# Patient Record
Sex: Male | Born: 1950 | Race: White | Hispanic: No
Health system: Midwestern US, Academic
[De-identification: ages and names within clinical notes are randomized; demographics above are authoritative.]

---

## 2017-01-26 ENCOUNTER — Ambulatory Visit: Admit: 2017-01-26 | Discharge: 2017-01-26 | Payer: MEDICAID

## 2017-01-26 ENCOUNTER — Ambulatory Visit: Admit: 2017-01-26 | Discharge: 2017-01-27 | Payer: MEDICAID

## 2017-01-26 ENCOUNTER — Encounter: Admit: 2017-01-26 | Discharge: 2017-01-26

## 2017-01-26 DIAGNOSIS — B1920 Unspecified viral hepatitis C without hepatic coma: ICD-10-CM

## 2017-01-26 DIAGNOSIS — I10 Essential (primary) hypertension: Principal | ICD-10-CM

## 2017-01-26 DIAGNOSIS — K862 Cyst of pancreas: Principal | ICD-10-CM

## 2017-01-26 LAB — COMPREHENSIVE METABOLIC PANEL
Lab: 0.8 mg/dL — ABNORMAL HIGH (ref 0.4–1.24)
Lab: 1.2 mg/dL (ref 0.3–1.2)
Lab: 127 U/L — ABNORMAL HIGH (ref 25–110)
Lab: 139 MMOL/L (ref 137–147)
Lab: 28 MMOL/L (ref 21–30)
Lab: 4.1 g/dL (ref 3.5–5.0)
Lab: 4.3 MMOL/L (ref 3.5–5.1)
Lab: 5 (ref 3–12)
Lab: 50 U/L (ref 7–56)
Lab: 50 U/L — ABNORMAL HIGH (ref 7–40)
Lab: 7.5 g/dL (ref 6.0–8.0)
Lab: 9.8 mg/dL (ref 8.5–10.6)
Lab: >60 mL/min (ref >60)
Lab: >60 mL/min (ref >60)

## 2017-01-26 LAB — CELIAC SCREEN

## 2017-01-26 LAB — CBC
Lab: 4.4 M/UL (ref 4.4–5.5)
Lab: 8.8 10*3/uL (ref 4.5–11.0)

## 2017-01-26 LAB — GLIADIN, DEAMIDATED IGA: Lab: 12 U/mL (ref <15)

## 2017-01-26 MED ORDER — SODIUM CHLORIDE 0.9 % IV SOLP
INTRAVENOUS | 0 refills | Status: CN
Start: 2017-01-26 — End: ?

## 2017-01-26 NOTE — Progress Notes
Subjective:       History of Present Illness  Alexis Weeks is a 66 y.o. male.  This  is a 66 year old Caucasian male who is been referred to [place] for abnormality in the pancreas seen on MRI.  Patient underwent an MRI/MRCP in August 2017.  He is unable to tell me what was the reason for this.  This reported an abrupt cutoff at the level of the distal bile duct.  This was suspicious for a papillary mass.  Following this patient had a repeat MRI done in June 2018.  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  Patient has been complaining of some weight loss.  He has lost about 40 pounds in the last 1 year.  He describes his appetite has been okay.  However he has a bowel movement after eating and therefore cut down on his p.o. intake.  His last colonoscopy was done in 2017 and was 3 tubular adenomas were removed.  He is status post cholecystectomy.  He also has chronic hepatitis C     Social history???she is single, has no children, smokes 10 cigarettes a day.  He has been smoking for the last 30years, does not drink alcohol and quit in 1985.  Prior to that drank heavily for about 15 years    Family history is noncontributory.       Review of Systems   Respiratory: Positive for cough and shortness of breath.    Cardiovascular: Positive for leg swelling.   Gastrointestinal: Positive for abdominal pain.   Musculoskeletal: Positive for back pain.   Psychiatric/Behavioral: Positive for sleep disturbance.   All other systems reviewed and are negative.  Comprehensive 10 point ROS taken, and otherwise negative except as above.      Active Ambulatory Problems     Diagnosis Date Noted   ??? Essential hypertension 02/24/2015   ??? 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 Chronic atrial fibrillation (HCC) 02/24/2015   ??? Hx of spinal surgery 02/24/2015     Resolved Ambulatory Problems     Diagnosis Date Noted   ??? No Resolved Ambulatory Problems     Past Medical History: Diagnosis Date   ??? Hypertension        Social History     Social History   ??? Marital status: Divorced     Spouse name: N/A   ??? Number of children: N/A   ??? Years of education: N/A     Social History Main Topics   ??? Smoking status: Current Every Day Smoker   ??? Smokeless tobacco: Never Used   ??? Alcohol use No   ??? Drug use: Yes     Types: Marijuana, Methamphetamines, Cocaine, Heroin   ??? Sexual activity: Not on file     Other Topics Concern   ??? Not on file     Social History Narrative   ??? No narrative on file       Family History   Problem Relation Age of Onset   ??? Arthritis Mother    ??? Hearing Loss Mother    ??? Alcohol abuse Father    ??? COPD Sister    ??? Alcohol abuse Brother        Allergies   Allergen Reactions   ??? Penicillins SEE COMMENTS     swelling       Patient Active Problem List    Diagnosis Date Noted   ??? Essential hypertension 02/24/2015   ???  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 Chronic atrial fibrillation (HCC) 02/24/2015   ??? Hx of spinal surgery 02/24/2015           Objective:         ??? azithromycin (ZITHROMAX) 250 mg tablet    ??? diltiazem CD (CARDIZEM CD) 180 mg capsule Take 1 Cap by mouth daily. Indications: HYPERTENSION, VENTRICULAR RATE CONTROL IN ATRIAL FIBRILLATION   ??? ibuprofen (MOTRIN) 800 mg tablet Take 1 Tab by mouth every 6 hours as needed for Pain. Take with food.   ??? metoprolol tartrate (LOPRESSOR) 50 mg tablet Take 1 Tab by mouth twice daily. Indications: HYPERTENSION, VENTRICULAR RATE CONTROL IN ATRIAL FIBRILLATION (Patient taking differently: Take 100 mg by mouth twice daily.)   ??? oxycodone(+) (ROXICODONE, OXY-IR) 20 mg tablet TK 1 T PO TID PRF PAIN     Vitals:    01/26/17 1017   BP: (!) 182/113   Pulse: 112   Resp: 18   Temp: 36.2 ???C (97.2 ???F)   TempSrc: Oral   Weight: 63 kg (139 lb)   Height: 182.9 cm (72)     Body mass index is 18.85 kg/m???.     Physical Exam  General appearance  alert, cooperative, no distress, appears stated age Head  Normocephalic, without obvious abnormality, atraumatic   Eyes  conjunctivae/corneas clear. EOM's intact. pupils equally round, accommodation normal.   Nose Nares normal. No drainage or sinus tenderness.   Throat Lips, mucosa, and tongue normal. Teeth and gums normal   Neck supple, symmetrical, trachea midline, and no JVD   Back   symmetric, no curvature. ROM normal. No CVA tenderness   Lungs   clear to auscultation bilaterally   Chest wall  no tenderness   Heart  regular rate and rhythm, S1, S2 normal, no murmur, click, rub or gallop   Abdomen   soft, tender in the epigastrium. Bowel sounds normal. No masses,  No organomegaly   Extremities extremities normal, atraumatic, no cyanosis or edema   Pulses 2+ and symmetric   Skin Skin color, texture, turgor normal. No rashes or lesions   Neurologic Normal      Assessment and Plan:  This  is a 66 year old Caucasian male who is been referred to [place] for abnormality in the pancreas seen on MRI.  Patient underwent an MRI/MRCP in August 2017.  He is unable to tell me what was the reason for this.  This reported an abrupt cutoff at the level of the distal bile duct.  This was suspicious for a papillary mass.  Following this patient had a repeat MRI done in June 2018.  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  Patient has been complaining of some weight loss.  He has lost about 40 pounds in the last 1 year.  He describes his appetite has been okay.  However he has a bowel movement after eating and therefore cut down on his p.o. intake.  His last colonoscopy was done in 2017 and was 3 tubular adenomas were removed.  He is status post cholecystectomy.  He also has chronic hepatitis C     I discussed with the patient that we will proceed with an endoscopic ultrasound to evaluate the pancreatic cystic lesion.  Likelihood of something more serious like a pancreatic malignancy is low.  We will also evaluate for chronic pancreatitis given his history of weight loss, chronic smoking as well as excessive alcohol intake in  the past.    We will check CBC, CMP, celiac serologies.    He also has history of chronic hepatitis C and we will refer him to hepatology for this.    Follow-up on an as-needed basis.  Thank you for allowing [place] to participate in his care and please feel free to call us if you have any questions.

## 2017-01-28 ENCOUNTER — Encounter: Admit: 2017-01-28 | Discharge: 2017-01-28

## 2017-01-28 NOTE — Telephone Encounter
Received new referral, via fax. Docs scanned in 01/28/17.

## 2017-01-31 NOTE — Telephone Encounter
Referring provider contacted via staff message requesting HCV labs.

## 2017-02-01 ENCOUNTER — Encounter: Admit: 2017-02-01 | Discharge: 2017-02-01

## 2017-02-01 DIAGNOSIS — K862 Cyst of pancreas: Principal | ICD-10-CM

## 2017-02-01 NOTE — Telephone Encounter
Spoke with pt who denies being diabetic or blood thinning medication. Pt scheduled for EUS on 04/01/16 1430 for 1300 arrival. Pt aware NPO status after midnight, pt will need driver day of procedure, pt aware of location of procedure. Prep instructions mailed to pts home address.

## 2017-02-14 ENCOUNTER — Encounter: Admit: 2017-02-14 | Discharge: 2017-02-14

## 2017-03-02 ENCOUNTER — Encounter: Admit: 2017-03-02 | Discharge: 2017-03-02

## 2017-03-17 ENCOUNTER — Encounter: Admit: 2017-03-17 | Discharge: 2017-03-17

## 2017-03-17 DIAGNOSIS — Z8619 Personal history of other infectious and parasitic diseases: Principal | ICD-10-CM

## 2017-04-01 ENCOUNTER — Encounter: Admit: 2017-04-01 | Discharge: 2017-04-01

## 2017-04-01 ENCOUNTER — Ambulatory Visit: Admit: 2017-04-01 | Discharge: 2017-04-01 | Payer: MEDICAID

## 2017-04-01 ENCOUNTER — Ambulatory Visit: Admit: 2017-04-01 | Discharge: 2017-04-01

## 2017-04-01 DIAGNOSIS — I10 Essential (primary) hypertension: ICD-10-CM

## 2017-04-01 DIAGNOSIS — R634 Abnormal weight loss: ICD-10-CM

## 2017-04-01 DIAGNOSIS — G8929 Other chronic pain: ICD-10-CM

## 2017-04-01 DIAGNOSIS — K862 Cyst of pancreas: Principal | ICD-10-CM

## 2017-04-01 LAB — HEPATITIS C ANTIBODY W REFLEX HCV PCR QUANT

## 2017-04-01 MED ORDER — FENTANYL CITRATE (PF) 50 MCG/ML IJ SOLN
25 ug | INTRAVENOUS | 0 refills | Status: CN | PRN
Start: 2017-04-01 — End: ?

## 2017-04-01 MED ORDER — ONDANSETRON HCL (PF) 4 MG/2 ML IJ SOLN
4 mg | Freq: Once | INTRAVENOUS | 0 refills | Status: CN | PRN
Start: 2017-04-01 — End: ?

## 2017-04-01 MED ORDER — LACTATED RINGERS IV SOLP
1000 mL | INTRAVENOUS | 0 refills | Status: DC
Start: 2017-04-01 — End: 2017-04-02
  Administered 2017-04-01: 21:00:00 1000.000 mL via INTRAVENOUS

## 2017-04-01 MED ORDER — FENTANYL CITRATE (PF) 50 MCG/ML IJ SOLN
50 ug | INTRAVENOUS | 0 refills | Status: CN | PRN
Start: 2017-04-01 — End: ?

## 2017-04-01 MED ORDER — PROPOFOL 10 MG/ML IV EMUL 50 ML (INFUSION)(AM)(OR)
INTRAVENOUS | 0 refills | Status: DC
Start: 2017-04-01 — End: 2017-04-01
  Administered 2017-04-01: 21:00:00 50000 ug via INTRAVENOUS
  Administered 2017-04-01: 22:00:00 150 ug/kg/min via INTRAVENOUS

## 2017-04-01 MED ORDER — HALOPERIDOL LACTATE 5 MG/ML IJ SOLN
1 mg | Freq: Once | INTRAVENOUS | 0 refills | Status: CN | PRN
Start: 2017-04-01 — End: ?

## 2017-04-01 MED ORDER — LIDOCAINE (PF) 200 MG/10 ML (2 %) IJ SYRG
0 refills | Status: DC
Start: 2017-04-01 — End: 2017-04-01
  Administered 2017-04-01: 22:00:00 100 mg via INTRAVENOUS

## 2017-04-01 NOTE — Discharge Planning (AHS/AVS)
EGD/Upper EUS/ERCP/Antegrade Enteroscopy  Post Upper Endoscopy Instructions      -Start with small sips of water at _____________.  If tolerated well, you may advance your diet as tolerated or directed by your physician.      -You may have a sore throat after the procedure for 2-3 days.  Try sucrets or lozenges to help ease the pain.  If it continues please contact us.    -If you feel feverish, have a temperature of 101 degrees or higher, persistent nausea and vomiting, abdominal pain or dark stools; please notify your nurse or GI physician.    -You may have abdominal cramping following the procedure this can be relieved by belching or passing air.    -If you have redness or swelling at the IV site, place a warm, wet washcloth over the affected areas for 15 minutes, 3-4 times a day until the redness subsides.  If symptoms continue for 2-3 days, contact your regular physician.    - If you have bleeding from your mouth, over 2 tablespoons and increasing, please notify your physician.  A small amount of bleeding is normal if a biopsy or polyps were taken.  If you are vomiting blood you need to seek immediate medical attention.    - You may resume all your routine medications, if medications need to be held your physician and/or nurse will notify you post procedure.    SPECIFIC INSTRUCTIONS  OUTPATIENTS:  A. Because of sedation and lack of coordination, UNTIL TOMORROW, DO NOT:  1. Operate any motorized vehicle - this includes driving.  2. Sign any legal documents or conduct important business matters.  3. Use any dangerous machinery (chain saw, lawnmower, etc.).  4. Drink any alcoholic beverages.  Should you have any questions or concerns after your procedure please call [phone] M-F 8am-5:00 pm. After 5:00 pm, holidays or weekends call [phone] and ask for the GI Doctor on call.    Diet after Procedure:    Please return to your previous diet as tolerated.

## 2017-04-01 NOTE — H&P (View-Only)
Pre Procedure History and Physical/Sedation Plan    Name:Alexis Weeks                                                                   MRN: [id number]                 DOB:10-08-1950          Age: 67 y.o.  Date of Service: @DATE @     Date of Procedure:  04/01/2017    Planned Procedure(s):  GI:  endoscopic ultrasound  Sedation/Medication Plan: MAC (Monitored Anesthesia Care)  Discussion/Reviews:  Physician has discussed risks and alternatives of this type of sedation and above planned procedures with patient  ___________________________________________________________________  Chief Complaint:  Weight loss, pancreatic cyst    History of Present Illness: Alexis Weeks is a 67 y.o. male with above here for EUS    Previous Anesthetic/Sedation History:  reviewed    Past Medical History:   Diagnosis Date   ??? Hypertension      Past Surgical History:   Procedure Laterality Date   ??? SPINE SURGERY  2004   ??? HX TURP  2013   ??? HERNIA REPAIR  2014    Ventral Hernia   ??? FRACTURE SURGERY     ??? HX CHOLECYSTECTOMY       Pertinent medical/surgical history reviewed  Pertinent family history reviewed  Social History     Tobacco Use   ??? Smoking status: Current Every Day Smoker   ??? Smokeless tobacco: Never Used   Substance Use Topics   ??? Alcohol use: No     Alcohol/week: 0.0 oz   ??? Drug use: Yes     Types: Marijuana, Methamphetamines, Cocaine, Heroin     Social History     Substance and Sexual Activity   Drug Use Yes   ??? Types: Marijuana, Methamphetamines, Cocaine, Heroin     Allergies:  Penicillins  Medications  Current Facility-Administered Medications   Medication   ??? lactated ringers infusion     Facility-Administered Medications Ordered in Other Encounters   Medication   ??? lidocaine (PF) injection   ??? propofol (DIPRIVAN) infusion     Review of Systems:  All other systems reviewed and are negative.           Physical Exam:  Temp: 36.5 ???C (97.7 ???F) (01/18 1504)  Pulse: 87 (01/18 1504) Respirations: 19 PER MINUTE (01/18 1504)  BP: 160/105 (01/18 1504)  General appearance: alert and well-developed  Throat: Lips, mucosa, and tongue normal. Teeth and gums normal  Lungs: clear to auscultation bilaterally  Heart: regular rate and rhythm, S1, S2 normal, no murmur, click, rub or gallop  Abdomen: soft, non-tender. Bowel sounds normal. No masses,  no organomegaly  Extremities: extremities normal, atraumatic, no cyanosis or edema  @  Airway:  airway assessment performed  Mallampati I (soft palate, uvula, fauces, tonsillar pillars visible)  Anesthesia Classification:  Per Anesthesia  NPO Status: Acceptable  Pregnancy Status: N/A    Lab/Radiology/Other Diagnostic Tests  Labs:  Relevant labs reviewed      Lawana Pai, MD  Pager

## 2017-04-05 ENCOUNTER — Encounter: Admit: 2017-04-05 | Discharge: 2017-04-05

## 2017-04-05 DIAGNOSIS — I10 Essential (primary) hypertension: Principal | ICD-10-CM

## 2017-04-06 LAB — HEPATITIS C VIRAL LOAD PCR QUANT
Lab: 253 [IU]/mL — ABNORMAL HIGH (ref <12)
Lab: 6.4 [IU]/mL — ABNORMAL HIGH (ref <1.08)

## 2017-04-13 ENCOUNTER — Encounter: Admit: 2017-04-13 | Discharge: 2017-04-13

## 2017-04-13 MED ORDER — LIPASE-PROTEASE-AMYLASE (CREON 24,000) 24,000-76,000- 120,000 UNIT PO CPDR
3 | ORAL_CAPSULE | Freq: Three times a day (TID) | ORAL | 5 refills | 30.00000 days | Status: AC
Start: 2017-04-13 — End: 2018-10-26

## 2017-05-03 ENCOUNTER — Encounter: Admit: 2017-05-03 | Discharge: 2017-05-03

## 2017-05-03 DIAGNOSIS — B1920 Unspecified viral hepatitis C without hepatic coma: Principal | ICD-10-CM

## 2017-05-04 NOTE — Telephone Encounter
Patient Name: Chanz Cahall    DOB: 09/19/1950    Insurance: [place] Medicaid    Provider Info --   - Referring: Dr. Candi Leash   - PCP: Dr. Ida Rogue    Radiology/Facility: none    Pathology/Facility: none    Endoscopy/Facility: EUS 03/2017 [place]    Reason for Visit/Diagnosis: HCV    HPI Summary: +HCV Ab and PCR quant, mildly elevated liver enzymes.    Appointment Needs --   - Provider: next available NP or MD   - Urgency: next available   - Department: LTC   - Other Public house manager, Transportation, Museum/gallery curator, Doctor, hospital)

## 2017-05-04 NOTE — Telephone Encounter
Received additional referral for patient to see hepatology. Docs in O2.

## 2017-05-10 ENCOUNTER — Encounter: Admit: 2017-05-10 | Discharge: 2017-05-10

## 2017-05-10 NOTE — Telephone Encounter
Spoke with patient scheduled initial consultation with  Dr. Pricilla Larsson on September 28, 2017 at 2:20pm.  Verified demos, mailed appointment reminder letter/map. Pt stated they have current insurance.

## 2017-06-16 ENCOUNTER — Encounter: Admit: 2017-06-16 | Discharge: 2017-06-16

## 2017-06-16 DIAGNOSIS — I10 Essential (primary) hypertension: Principal | ICD-10-CM

## 2017-07-05 ENCOUNTER — Encounter: Admit: 2017-07-05 | Discharge: 2017-07-05

## 2017-07-05 DIAGNOSIS — B182 Chronic viral hepatitis C: ICD-10-CM

## 2017-07-05 DIAGNOSIS — K759 Inflammatory liver disease, unspecified: Principal | ICD-10-CM

## 2017-07-28 ENCOUNTER — Ambulatory Visit: Admit: 2017-07-28 | Discharge: 2017-07-28 | Payer: MEDICAID

## 2017-07-28 DIAGNOSIS — K759 Inflammatory liver disease, unspecified: Principal | ICD-10-CM

## 2017-07-28 DIAGNOSIS — B182 Chronic viral hepatitis C: ICD-10-CM

## 2017-09-07 DIAGNOSIS — I482 Chronic atrial fibrillation, unspecified: ICD-10-CM

## 2017-09-08 ENCOUNTER — Inpatient Hospital Stay: Admit: 2017-09-08 | Discharge: 2017-09-08

## 2017-09-08 ENCOUNTER — Encounter: Admit: 2017-09-08 | Discharge: 2017-09-08

## 2017-09-08 DIAGNOSIS — R042 Hemoptysis: Principal | ICD-10-CM

## 2017-09-08 LAB — CBC AND DIFF
Lab: 0 10*3/uL (ref 0–0.20)
Lab: 0.3 10*3/uL (ref 0–0.45)
Lab: 8 10*3/uL (ref 4.5–11.0)

## 2017-09-08 LAB — URINALYSIS DIPSTICK
Lab: 7 mg/dL (ref 5.0–8.0)
Lab: NEGATIVE K/UL (ref 3–12)
Lab: NEGATIVE MMOL/L (ref 21–30)
Lab: NEGATIVE U/L (ref 25–110)
Lab: NEGATIVE U/L (ref 7–56)
Lab: NEGATIVE g/dL — ABNORMAL LOW (ref 3.5–5.0)
Lab: NEGATIVE g/dL — ABNORMAL LOW (ref 6.0–8.0)
Lab: NEGATIVE mg/dL (ref 0.3–1.2)
Lab: NEGATIVE mg/dL — ABNORMAL LOW (ref 8.5–10.6)

## 2017-09-08 LAB — COMPREHENSIVE METABOLIC PANEL
Lab: 143 MMOL/L — ABNORMAL LOW (ref 137–147)
Lab: >60 mL/min (ref >60)
Lab: >60 mL/min (ref >60)

## 2017-09-08 LAB — URINALYSIS, MICROSCOPIC

## 2017-09-08 LAB — POC LACTATE: Lab: 0.8 MMOL/L (ref 0.5–2.0)

## 2017-09-08 LAB — LIPASE: Lab: 16 U/L — ABNORMAL LOW (ref 11–82)

## 2017-09-08 MED ORDER — DILTIAZEM HCL 180 MG PO CP24
180 mg | Freq: Every day | ORAL | 0 refills | Status: DC
Start: 2017-09-08 — End: 2017-09-08

## 2017-09-08 MED ORDER — SODIUM CHLORIDE 0.9 % IJ SOLN
50 mL | Freq: Once | INTRAVENOUS | 0 refills | Status: CP
Start: 2017-09-08 — End: ?
  Administered 2017-09-08: 12:00:00 50 mL via INTRAVENOUS

## 2017-09-08 MED ORDER — METOPROLOL TARTRATE 100 MG PO TAB
100 mg | Freq: Two times a day (BID) | ORAL | 0 refills | Status: DC
Start: 2017-09-08 — End: 2017-09-13
  Administered 2017-09-09 – 2017-09-13 (×9): 100 mg via ORAL

## 2017-09-08 MED ORDER — LIPASE-PROTEASE-AMYLASE (CREON 24,000) 24,000-76,000- 120,000 UNIT PO CPDR
3 | Freq: Three times a day (TID) | ORAL | 0 refills | Status: CN
Start: 2017-09-08 — End: ?

## 2017-09-08 MED ORDER — PEG-ELECTROLYTE SOLN 420 GRAM PO SOLR
4 L | ORAL | 0 refills | Status: DC
Start: 2017-09-08 — End: 2017-09-08

## 2017-09-08 MED ORDER — OXYCODONE 10 MG PO TAB
10 mg | ORAL | 0 refills | Status: DC | PRN
Start: 2017-09-08 — End: 2017-09-13
  Administered 2017-09-08 – 2017-09-13 (×11): 10 mg via ORAL

## 2017-09-08 MED ORDER — ACETAMINOPHEN 325 MG PO TAB
650 mg | ORAL | 0 refills | Status: DC | PRN
Start: 2017-09-08 — End: 2017-09-13

## 2017-09-08 MED ORDER — ALBUTEROL SULFATE 90 MCG/ACTUATION IN HFAA
1-2 | RESPIRATORY_TRACT | 0 refills | Status: DC | PRN
Start: 2017-09-08 — End: 2017-09-13

## 2017-09-08 MED ORDER — LIPASE-PROTEASE-AMYLASE (CREON 24,000) 24,000-76,000- 120,000 UNIT PO CPDR
3 | Freq: Three times a day (TID) | ORAL | 0 refills | Status: DC
Start: 2017-09-08 — End: 2017-09-13
  Administered 2017-09-08 – 2017-09-13 (×11): 3 via ORAL

## 2017-09-08 MED ORDER — METHYLPREDNISOLONE SOD SUC(PF) 125 MG/2 ML IJ SOLR
62.5 mg | Freq: Once | INTRAVENOUS | 0 refills | Status: CP
Start: 2017-09-08 — End: ?
  Administered 2017-09-08: 13:00:00 62.5 mg via INTRAVENOUS

## 2017-09-08 MED ORDER — DOXYCYCLINE HYCLATE 100 MG PO TAB
100 mg | Freq: Two times a day (BID) | ORAL | 0 refills | Status: DC
Start: 2017-09-08 — End: 2017-09-11
  Administered 2017-09-08 – 2017-09-11 (×6): 100 mg via ORAL

## 2017-09-08 MED ORDER — PATIENTS OWN MEDICATION
1 | Freq: Every day | ORAL | 0 refills | Status: DC
Start: 2017-09-08 — End: 2017-09-08

## 2017-09-08 MED ORDER — METOPROLOL TARTRATE 50 MG PO TAB
50 mg | Freq: Two times a day (BID) | ORAL | 0 refills | Status: DC
Start: 2017-09-08 — End: 2017-09-08

## 2017-09-08 MED ORDER — SODIUM CHLORIDE 0.9 % IV SOLP
INTRAVENOUS | 0 refills | Status: CN
Start: 2017-09-08 — End: ?

## 2017-09-08 MED ORDER — IOHEXOL 350 MG IODINE/ML IV SOLN
65 mL | Freq: Once | INTRAVENOUS | 0 refills | Status: CP
Start: 2017-09-08 — End: ?
  Administered 2017-09-08: 12:00:00 65 mL via INTRAVENOUS

## 2017-09-08 MED ORDER — DULOXETINE 60 MG PO CPDR
60 mg | Freq: Every day | ORAL | 0 refills | Status: DC
Start: 2017-09-08 — End: 2017-09-13
  Administered 2017-09-08 – 2017-09-13 (×5): 60 mg via ORAL

## 2017-09-08 MED ORDER — GLECAPREVIR-PIBRENTASVIR 100-40 MG PO TAB
3 | Freq: Every day | ORAL | 0 refills | Status: DC
Start: 2017-09-08 — End: 2017-09-13

## 2017-09-08 MED ORDER — LISINOPRIL 10 MG PO TAB
10 mg | Freq: Every day | ORAL | 0 refills | Status: DC
Start: 2017-09-08 — End: 2017-09-13
  Administered 2017-09-08 – 2017-09-13 (×6): 10 mg via ORAL

## 2017-09-08 MED ORDER — CYCLOBENZAPRINE 10 MG PO TAB
10 mg | Freq: Three times a day (TID) | ORAL | 0 refills | Status: DC | PRN
Start: 2017-09-08 — End: 2017-09-13

## 2017-09-09 DIAGNOSIS — K920 Hematemesis: Secondary | ICD-10-CM

## 2017-09-09 LAB — IMMUNOGLOBULIN E (IGE): Lab: 17 [IU]/mL (ref <165)

## 2017-09-09 LAB — RVP VIRAL PANEL PCR

## 2017-09-09 LAB — CBC
Lab: 10 10*3/uL (ref 4.5–11.0)
Lab: 104 FL — ABNORMAL HIGH (ref 80–100)
Lab: 11 g/dL — ABNORMAL LOW (ref 13.5–16.5)
Lab: 14 % (ref 11–15)
Lab: 213 10*3/uL (ref 150–400)
Lab: 3.1 M/UL — ABNORMAL LOW (ref 4.4–5.5)
Lab: 32 % — ABNORMAL LOW (ref 40–50)
Lab: 33 g/dL (ref 32.0–36.0)
Lab: 35 pg — ABNORMAL HIGH (ref 26–34)
Lab: 8 FL (ref 7–11)
Lab: 11 K/UL — ABNORMAL HIGH (ref 4.5–11.0)

## 2017-09-09 LAB — GLOMERULAR BASEMENT MEMBRANE ANTIBODIES

## 2017-09-09 LAB — C REACTIVE PROTEIN (CRP): Lab: 0.4 mg/dL (ref <1.0)

## 2017-09-09 LAB — COMPREHENSIVE METABOLIC PANEL: Lab: 141 MMOL/L — ABNORMAL HIGH (ref >60)

## 2017-09-09 LAB — MAGNESIUM: Lab: 1.8 mg/dL — ABNORMAL HIGH (ref 1.6–2.6)

## 2017-09-09 LAB — PHOSPHORUS: Lab: 3.2 mg/dL — ABNORMAL LOW (ref 2.0–4.5)

## 2017-09-09 LAB — ANTI-NUCLEAR ANTIBODY(ANA): Lab: <80 {titer} (ref <80)

## 2017-09-09 LAB — PROCALCITONIN: Lab: 0 ng/mL — ABNORMAL HIGH (ref <0.10)

## 2017-09-09 LAB — ANTI-NEUT CYTO AB (ANCA/PANCA): Lab: <20 {titer} (ref >60)

## 2017-09-09 LAB — IMMUNOGLOBULINS-IGA,IGG,IGM: Lab: 105 mg/dL — ABNORMAL HIGH (ref 762–1488)

## 2017-09-09 LAB — HIV 1& 2 AG-AB SCRN W REFLEX HIV 1 PCR QUANT: Lab: NEGATIVE M/UL — ABNORMAL LOW (ref 4.4–5.5)

## 2017-09-09 LAB — CBC AND DIFF: Lab: 15 K/UL — ABNORMAL HIGH (ref >60)

## 2017-09-09 LAB — SED RATE: Lab: 8 mm/h (ref 0–20)

## 2017-09-10 LAB — PROTIME INR (PT): Lab: 1.1 U/L (ref 0.8–1.2)

## 2017-09-10 LAB — PHOSPHORUS: Lab: 3.9 mg/dL — ABNORMAL LOW (ref 2.0–4.5)

## 2017-09-10 LAB — CBC
Lab: 104 FL — ABNORMAL HIGH (ref 80–100)
Lab: 14 % (ref 11–15)
Lab: 179 10*3/uL (ref 150–400)
Lab: 2.7 M/UL — ABNORMAL LOW (ref 4.4–5.5)
Lab: 28 % — ABNORMAL LOW (ref 40–50)
Lab: 32 g/dL (ref 32.0–36.0)
Lab: 33 pg (ref 26–34)
Lab: 6.2 10*3/uL (ref 4.5–11.0)
Lab: 8.5 FL (ref 7–11)
Lab: 9.3 g/dL — ABNORMAL LOW (ref 13.5–16.5)

## 2017-09-10 LAB — MAGNESIUM: Lab: 1.7 mg/dL — ABNORMAL HIGH (ref 1.6–2.6)

## 2017-09-10 LAB — CBC AND DIFF: Lab: 7.4 K/UL — ABNORMAL LOW (ref 4.5–11.0)

## 2017-09-10 LAB — COMPREHENSIVE METABOLIC PANEL
Lab: 0.8 mg/dL — ABNORMAL LOW (ref >60)
Lab: 139 MMOL/L — ABNORMAL LOW (ref 137–147)
Lab: 4.4 MMOL/L — ABNORMAL HIGH (ref 3.5–5.1)
Lab: 93 mg/dL — ABNORMAL HIGH (ref 70–100)

## 2017-09-10 MED ORDER — OXYCODONE 10 MG PO TAB
10 mg | Freq: Once | ORAL | 0 refills | Status: CP
Start: 2017-09-10 — End: ?
  Administered 2017-09-11: 04:00:00 10 mg via ORAL

## 2017-09-11 LAB — T SPOT TB (QUANTIFERON TB)
Lab: 0 mL/min — ABNORMAL LOW (ref >60)
Lab: NEGATIVE mg/dL — ABNORMAL HIGH (ref 38–328)

## 2017-09-11 LAB — MAGNESIUM: Lab: 1.6 mg/dL — ABNORMAL LOW (ref 1.6–2.6)

## 2017-09-11 LAB — COMPREHENSIVE METABOLIC PANEL: Lab: 139 MMOL/L — ABNORMAL LOW (ref >60)

## 2017-09-11 LAB — CBC AND DIFF: Lab: 6.8 K/UL (ref 4.5–11.0)

## 2017-09-11 LAB — CBC: Lab: 7.2 10*3/uL (ref 4.5–11.0)

## 2017-09-11 LAB — PHOSPHORUS: Lab: 3.7 mg/dL — ABNORMAL HIGH (ref >60)

## 2017-09-11 MED ORDER — DOXYCYCLINE HYCLATE 100 MG PO TAB
100 mg | Freq: Two times a day (BID) | ORAL | 0 refills | Status: CP
Start: 2017-09-11 — End: ?
  Administered 2017-09-12: 01:00:00 100 mg via ORAL

## 2017-09-12 ENCOUNTER — Encounter: Admit: 2017-09-12 | Discharge: 2017-09-12

## 2017-09-12 DIAGNOSIS — I10 Essential (primary) hypertension: Principal | ICD-10-CM

## 2017-09-12 LAB — PHOSPHORUS: Lab: 3.1 mg/dL — ABNORMAL LOW (ref >60)

## 2017-09-12 LAB — MAGNESIUM: Lab: 1.7 mg/dL — ABNORMAL LOW (ref 1.6–2.6)

## 2017-09-12 LAB — COMPREHENSIVE METABOLIC PANEL: Lab: 141 MMOL/L — ABNORMAL LOW (ref 137–147)

## 2017-09-12 LAB — CBC AND DIFF: Lab: 5.5 K/UL — ABNORMAL HIGH (ref >60)

## 2017-09-12 MED ORDER — LIDOCAINE (PF) 200 MG/10 ML (2 %) IJ SYRG
0 refills | Status: DC
Start: 2017-09-12 — End: 2017-09-12
  Administered 2017-09-12: 20:00:00 60 mg via INTRAVENOUS

## 2017-09-12 MED ORDER — GABAPENTIN 300 MG PO CAP
300 mg | Freq: Once | ORAL | 0 refills | Status: DC
Start: 2017-09-12 — End: 2017-09-13

## 2017-09-12 MED ORDER — FENTANYL CITRATE (PF) 50 MCG/ML IJ SOLN
25 ug | INTRAVENOUS | 0 refills | Status: DC | PRN
Start: 2017-09-12 — End: 2017-09-12

## 2017-09-12 MED ORDER — DIPHENHYDRAMINE HCL 50 MG/ML IJ SOLN
25 mg | Freq: Once | INTRAVENOUS | 0 refills | Status: DC | PRN
Start: 2017-09-12 — End: 2017-09-12

## 2017-09-12 MED ORDER — PHENYLEPHRINE IN 0.9% NACL(PF) 1 MG/10 ML (100 MCG/ML) IV SYRG
INTRAVENOUS | 0 refills | Status: DC
Start: 2017-09-12 — End: 2017-09-12
  Administered 2017-09-12: 21:00:00 100 ug via INTRAVENOUS

## 2017-09-12 MED ORDER — MIDAZOLAM 1 MG/ML IJ SOLN
INTRAVENOUS | 0 refills | Status: DC
Start: 2017-09-12 — End: 2017-09-12
  Administered 2017-09-12: 20:00:00 2 mg via INTRAVENOUS

## 2017-09-12 MED ORDER — FENTANYL CITRATE (PF) 50 MCG/ML IJ SOLN
0 refills | Status: DC
Start: 2017-09-12 — End: 2017-09-12
  Administered 2017-09-12: 20:00:00 25 ug via INTRAVENOUS

## 2017-09-12 MED ORDER — SODIUM CHLORIDE 0.9 % IV SOLP
500 mL | INTRAVENOUS | 0 refills | Status: CP
Start: 2017-09-12 — End: ?
  Administered 2017-09-13: 02:00:00 500 mL via INTRAVENOUS

## 2017-09-12 MED ORDER — OXYCODONE 5 MG PO TAB
5-10 mg | Freq: Once | ORAL | 0 refills | Status: DC | PRN
Start: 2017-09-12 — End: 2017-09-12

## 2017-09-12 MED ORDER — METOCLOPRAMIDE HCL 5 MG/ML IJ SOLN
10 mg | Freq: Once | INTRAVENOUS | 0 refills | Status: DC | PRN
Start: 2017-09-12 — End: 2017-09-12

## 2017-09-12 MED ORDER — PROMETHAZINE 25 MG/ML IJ SOLN
6.25 mg | INTRAVENOUS | 0 refills | Status: DC | PRN
Start: 2017-09-12 — End: 2017-09-12

## 2017-09-12 MED ORDER — FENTANYL CITRATE (PF) 50 MCG/ML IJ SOLN
50 ug | INTRAVENOUS | 0 refills | Status: DC | PRN
Start: 2017-09-12 — End: 2017-09-12

## 2017-09-12 MED ORDER — DEXTRAN 70-HYPROMELLOSE (PF) 0.1-0.3 % OP DPET
0 refills | Status: DC
Start: 2017-09-12 — End: 2017-09-12
  Administered 2017-09-12: 20:00:00 2 [drp] via OPHTHALMIC

## 2017-09-12 MED ORDER — ONDANSETRON HCL (PF) 4 MG/2 ML IJ SOLN
INTRAVENOUS | 0 refills | Status: DC
Start: 2017-09-12 — End: 2017-09-12

## 2017-09-12 MED ORDER — SUCCINYLCHOLINE CHLORIDE 20 MG/ML IJ SOLN
INTRAVENOUS | 0 refills | Status: DC
Start: 2017-09-12 — End: 2017-09-12
  Administered 2017-09-12: 20:00:00 80 mg via INTRAVENOUS

## 2017-09-12 MED ORDER — PROPOFOL INJ 10 MG/ML IV VIAL
0 refills | Status: DC
Start: 2017-09-12 — End: 2017-09-12
  Administered 2017-09-12: 20:00:00 120 mg via INTRAVENOUS

## 2017-09-12 MED ORDER — SODIUM CHLORIDE 0.9 % IV SOLP
INTRAVENOUS | 0 refills | Status: DC
Start: 2017-09-12 — End: 2017-09-13
  Administered 2017-09-12: 20:00:00 1000.000 mL via INTRAVENOUS

## 2017-09-13 ENCOUNTER — Emergency Department: Admit: 2017-09-08 | Discharge: 2017-09-08

## 2017-09-13 ENCOUNTER — Inpatient Hospital Stay: Admit: 2017-09-08 | Discharge: 2017-09-13 | Disposition: A | Discharge status: Home / Self Care | Payer: MEDICAID

## 2017-09-13 ENCOUNTER — Inpatient Hospital Stay: Admit: 2017-09-09 | Discharge: 2017-09-09

## 2017-09-13 ENCOUNTER — Encounter: Admit: 2017-09-13 | Discharge: 2017-09-13

## 2017-09-13 DIAGNOSIS — K920 Hematemesis: Secondary | ICD-10-CM

## 2017-09-13 DIAGNOSIS — J479 Bronchiectasis, uncomplicated: Principal | ICD-10-CM

## 2017-09-13 DIAGNOSIS — I10 Essential (primary) hypertension: ICD-10-CM

## 2017-09-13 DIAGNOSIS — I959 Hypotension, unspecified: ICD-10-CM

## 2017-09-13 DIAGNOSIS — I482 Chronic atrial fibrillation: ICD-10-CM

## 2017-09-13 DIAGNOSIS — K861 Other chronic pancreatitis: ICD-10-CM

## 2017-09-13 DIAGNOSIS — B182 Chronic viral hepatitis C: ICD-10-CM

## 2017-09-13 DIAGNOSIS — Z79899 Other long term (current) drug therapy: ICD-10-CM

## 2017-09-13 DIAGNOSIS — Z9049 Acquired absence of other specified parts of digestive tract: ICD-10-CM

## 2017-09-13 DIAGNOSIS — R636 Underweight: ICD-10-CM

## 2017-09-13 DIAGNOSIS — F17210 Nicotine dependence, cigarettes, uncomplicated: ICD-10-CM

## 2017-09-13 DIAGNOSIS — J439 Emphysema, unspecified: ICD-10-CM

## 2017-09-13 DIAGNOSIS — J189 Pneumonia, unspecified organism: ICD-10-CM

## 2017-09-13 DIAGNOSIS — J9811 Atelectasis: ICD-10-CM

## 2017-09-13 DIAGNOSIS — K8681 Exocrine pancreatic insufficiency: ICD-10-CM

## 2017-09-13 DIAGNOSIS — Z681 Body mass index (BMI) 19 or less, adult: ICD-10-CM

## 2017-09-13 DIAGNOSIS — J90 Pleural effusion, not elsewhere classified: Principal | ICD-10-CM

## 2017-09-13 LAB — RVP VIRAL PANEL PCR

## 2017-09-13 LAB — COMPREHENSIVE METABOLIC PANEL: Lab: 142 MMOL/L — ABNORMAL LOW (ref >60)

## 2017-09-13 LAB — GRAM STAIN

## 2017-09-13 LAB — MAGNESIUM: Lab: 1.7 mg/dL — ABNORMAL HIGH (ref 1.6–2.6)

## 2017-09-13 LAB — CELL COUNT W/DIFF-FLUIDS: Lab: 490 /uL (ref 15–500)

## 2017-09-13 LAB — CBC AND DIFF: Lab: 6.6 K/UL — ABNORMAL LOW (ref 4.5–11.0)

## 2017-09-13 LAB — PHOSPHORUS: Lab: 3.4 mg/dL (ref 2.0–4.5)

## 2017-09-13 LAB — PJP JIROVECI QUANT PCR

## 2017-09-13 LAB — HERPES SIMPLEX PCR - NON-BLOOD

## 2017-09-13 LAB — LEUKEMIA/LYMPHOMA PANEL FLUID/TISSUE

## 2017-09-13 LAB — ASPERGILLUS GALACTOMANN: Lab: 0 {cells}/uL (ref 1500–7800)

## 2017-09-13 MED ORDER — PREDNISONE 20 MG PO TAB
ORAL_TABLET | Freq: Every day | ORAL | 0 refills | Status: AC
Start: 2017-09-13 — End: ?

## 2017-09-13 MED ORDER — APIXABAN 5 MG PO TAB
5 mg | ORAL_TABLET | Freq: Two times a day (BID) | ORAL | 1 refills | Status: AC
Start: 2017-09-13 — End: 2017-12-27

## 2017-09-13 MED ORDER — PREDNISONE 20 MG PO TAB
ORAL_TABLET | Freq: Every day | ORAL | 0 refills | Status: AC
Start: 2017-09-13 — End: 2017-09-13
  Filled 2017-09-13 (×2): qty 15, 12d supply, fill #1

## 2017-09-13 MED ORDER — DOXYCYCLINE HYCLATE 100 MG PO TAB
100 mg | ORAL_TABLET | Freq: Two times a day (BID) | ORAL | 0 refills | 8.00000 days | Status: AC
Start: 2017-09-13 — End: ?

## 2017-09-14 ENCOUNTER — Encounter: Admit: 2017-09-14 | Discharge: 2017-09-14

## 2017-09-14 DIAGNOSIS — I10 Essential (primary) hypertension: Principal | ICD-10-CM

## 2017-09-14 LAB — CMV QUANT PCR-FLUID

## 2017-09-14 LAB — CULTURE-RESP,LOWER W/SENSITIVITY: Lab: LOW % — ABNORMAL HIGH (ref 11.0–15.0)

## 2017-09-26 ENCOUNTER — Ambulatory Visit: Admit: 2017-09-26 | Discharge: 2017-09-26 | Payer: MEDICAID

## 2017-09-26 DIAGNOSIS — J90 Pleural effusion, not elsewhere classified: Principal | ICD-10-CM

## 2017-09-28 ENCOUNTER — Encounter: Admit: 2017-09-28 | Discharge: 2017-09-28

## 2017-10-03 ENCOUNTER — Encounter: Admit: 2017-10-03 | Discharge: 2017-10-03

## 2017-10-11 ENCOUNTER — Encounter: Admit: 2017-10-11 | Discharge: 2017-10-11

## 2017-10-11 DIAGNOSIS — R042 Hemoptysis: Secondary | ICD-10-CM

## 2017-10-11 DIAGNOSIS — I482 Chronic atrial fibrillation, unspecified: Principal | ICD-10-CM

## 2017-10-11 DIAGNOSIS — I10 Essential (primary) hypertension: Principal | ICD-10-CM

## 2017-10-11 MED ORDER — DILTIAZEM HCL 240 MG PO CP24
240 mg | ORAL_CAPSULE | Freq: Every day | ORAL | 3 refills | 45.00000 days | Status: AC
Start: 2017-10-11 — End: 2018-12-28
  Filled 2017-10-11 (×2): qty 90, 30d supply, fill #1

## 2017-10-11 MED ORDER — DILTIAZEM HCL 240 MG PO CP24
240 mg | ORAL_CAPSULE | Freq: Every day | ORAL | 3 refills | 45.00000 days | Status: AC
Start: 2017-10-11 — End: 2017-10-11

## 2017-10-11 MED ORDER — AZITHROMYCIN 1 GRAM PO PACK
1 | Freq: Once | ORAL | 0 refills | Status: AC
Start: 2017-10-11 — End: 2017-10-12
  Filled 2017-10-11 (×2): qty 1, 1d supply, fill #1

## 2017-10-11 MED ORDER — AZITHROMYCIN 1 GRAM PO PACK
1 | Freq: Once | ORAL | 0 refills | Status: AC
Start: 2017-10-11 — End: 2017-10-11

## 2017-10-12 ENCOUNTER — Ambulatory Visit: Admit: 2017-10-11 | Discharge: 2017-10-12 | Payer: MEDICAID

## 2017-10-12 DIAGNOSIS — I2510 Atherosclerotic heart disease of native coronary artery without angina pectoris: ICD-10-CM

## 2017-10-12 DIAGNOSIS — I10 Essential (primary) hypertension: ICD-10-CM

## 2017-10-12 DIAGNOSIS — I493 Ventricular premature depolarization: ICD-10-CM

## 2017-10-12 DIAGNOSIS — I482 Chronic atrial fibrillation: Principal | ICD-10-CM

## 2017-10-12 DIAGNOSIS — B182 Chronic viral hepatitis C: ICD-10-CM

## 2017-10-12 MED ORDER — AZITHROMYCIN 250 MG PO TAB
ORAL_TABLET | Freq: Every day | ORAL | 0 refills | Status: AC
Start: 2017-10-12 — End: ?

## 2017-10-13 ENCOUNTER — Encounter: Admit: 2017-10-13 | Discharge: 2017-10-13

## 2017-10-17 LAB — CULTURE-FUNGAL,OTHER

## 2017-10-19 ENCOUNTER — Encounter: Admit: 2017-10-19 | Discharge: 2017-10-19

## 2017-10-19 ENCOUNTER — Ambulatory Visit: Admit: 2017-10-19 | Discharge: 2017-10-19

## 2017-10-20 ENCOUNTER — Ambulatory Visit: Admit: 2017-10-20 | Discharge: 2017-10-20 | Payer: MEDICAID

## 2017-10-20 ENCOUNTER — Ambulatory Visit: Admit: 2017-10-20 | Discharge: 2017-10-20

## 2017-10-20 DIAGNOSIS — I482 Chronic atrial fibrillation: Principal | ICD-10-CM

## 2017-10-20 MED ORDER — PROPOFOL INJ 10 MG/ML IV VIAL
0 refills | Status: DC
Start: 2017-10-20 — End: 2017-10-20
  Administered 2017-10-20: 13:00:00 50 mg via INTRAVENOUS
  Administered 2017-10-20: 13:00:00 10 mg via INTRAVENOUS
  Administered 2017-10-20: 13:00:00 40 mg via INTRAVENOUS

## 2017-10-20 MED ORDER — SODIUM CHLORIDE 0.9 % IV SOLP (OR) 500ML
0 refills | Status: DC
Start: 2017-10-20 — End: 2017-10-20
  Administered 2017-10-20: 13:00:00 via INTRAVENOUS

## 2017-10-20 MED ORDER — LIDOCAINE (PF) 200 MG/10 ML (2 %) IJ SYRG
0 refills | Status: DC
Start: 2017-10-20 — End: 2017-10-20
  Administered 2017-10-20: 13:00:00 100 mg via INTRAVENOUS

## 2017-10-20 MED ORDER — PROPOFOL 10 MG/ML IV EMUL 20 ML (INFUSION)(AM)(OR)
INTRAVENOUS | 0 refills | Status: DC
Start: 2017-10-20 — End: 2017-10-20
  Administered 2017-10-20: 13:00:00 100 ug/kg/min via INTRAVENOUS

## 2017-10-24 LAB — CULTURE-TB (AFB)

## 2017-10-31 LAB — CULTURE-TB (AFB)

## 2017-11-01 ENCOUNTER — Encounter: Admit: 2017-11-01 | Discharge: 2017-11-01

## 2017-11-01 DIAGNOSIS — I482 Chronic atrial fibrillation, unspecified: Principal | ICD-10-CM

## 2017-11-01 DIAGNOSIS — Z8719 Personal history of other diseases of the digestive system: ICD-10-CM

## 2017-11-01 DIAGNOSIS — I4819 Other persistent atrial fibrillation: Principal | ICD-10-CM

## 2017-11-01 DIAGNOSIS — I4891 Unspecified atrial fibrillation: Secondary | ICD-10-CM

## 2017-11-01 MED ORDER — LIDOCAINE (PF) 10 MG/ML (1 %) IJ SOLN
.1-2 mL | INTRAMUSCULAR | 0 refills | Status: CN | PRN
Start: 2017-11-01 — End: ?

## 2017-11-01 MED ORDER — SODIUM CHLORIDE 0.9 % IV SOLP
INTRAVENOUS | 0 refills | Status: CN
Start: 2017-11-01 — End: ?

## 2017-11-01 MED ORDER — LIDOCAINE HCL 2 % MM JELP
Freq: Once | TOPICAL | 0 refills | Status: CN
Start: 2017-11-01 — End: ?

## 2017-11-02 ENCOUNTER — Encounter: Admit: 2017-11-02 | Discharge: 2017-11-02

## 2017-11-02 DIAGNOSIS — I482 Chronic atrial fibrillation, unspecified: Principal | ICD-10-CM

## 2017-11-18 ENCOUNTER — Encounter: Admit: 2017-11-18 | Discharge: 2017-11-18

## 2017-11-30 ENCOUNTER — Encounter: Admit: 2017-11-30 | Discharge: 2017-11-30

## 2017-11-30 DIAGNOSIS — I10 Essential (primary) hypertension: Principal | ICD-10-CM

## 2017-12-01 ENCOUNTER — Encounter: Admit: 2017-12-01 | Discharge: 2017-12-01

## 2017-12-01 ENCOUNTER — Ambulatory Visit: Admit: 2017-12-01 | Discharge: 2017-12-02 | Payer: MEDICAID

## 2017-12-01 ENCOUNTER — Ambulatory Visit: Admit: 2017-12-01 | Discharge: 2017-12-01

## 2017-12-01 DIAGNOSIS — I482 Chronic atrial fibrillation, unspecified: ICD-10-CM

## 2017-12-01 DIAGNOSIS — R079 Chest pain, unspecified: ICD-10-CM

## 2017-12-01 DIAGNOSIS — I4891 Unspecified atrial fibrillation: ICD-10-CM

## 2017-12-01 DIAGNOSIS — J449 Chronic obstructive pulmonary disease, unspecified: ICD-10-CM

## 2017-12-01 DIAGNOSIS — K759 Inflammatory liver disease, unspecified: ICD-10-CM

## 2017-12-01 DIAGNOSIS — I10 Essential (primary) hypertension: Secondary | ICD-10-CM

## 2017-12-01 DIAGNOSIS — Z8719 Personal history of other diseases of the digestive system: ICD-10-CM

## 2017-12-02 DIAGNOSIS — I10 Essential (primary) hypertension: ICD-10-CM

## 2017-12-08 ENCOUNTER — Encounter: Admit: 2017-12-08 | Discharge: 2017-12-08

## 2017-12-14 ENCOUNTER — Encounter: Admit: 2017-12-14 | Discharge: 2017-12-14

## 2017-12-14 ENCOUNTER — Ambulatory Visit: Admit: 2017-12-14 | Discharge: 2017-12-14

## 2017-12-14 ENCOUNTER — Ambulatory Visit: Admit: 2017-12-14 | Discharge: 2017-12-14 | Payer: MEDICAID

## 2017-12-14 DIAGNOSIS — R042 Hemoptysis: ICD-10-CM

## 2017-12-14 DIAGNOSIS — J449 Chronic obstructive pulmonary disease, unspecified: ICD-10-CM

## 2017-12-14 DIAGNOSIS — Z23 Encounter for immunization: Principal | ICD-10-CM

## 2017-12-14 DIAGNOSIS — J479 Bronchiectasis, uncomplicated: ICD-10-CM

## 2017-12-14 DIAGNOSIS — J90 Pleural effusion, not elsewhere classified: ICD-10-CM

## 2017-12-14 DIAGNOSIS — I4891 Unspecified atrial fibrillation: ICD-10-CM

## 2017-12-14 DIAGNOSIS — Z8719 Personal history of other diseases of the digestive system: ICD-10-CM

## 2017-12-14 DIAGNOSIS — K759 Inflammatory liver disease, unspecified: ICD-10-CM

## 2017-12-14 DIAGNOSIS — I10 Essential (primary) hypertension: Principal | ICD-10-CM

## 2017-12-14 DIAGNOSIS — R079 Chest pain, unspecified: ICD-10-CM

## 2017-12-14 MED ORDER — UMECLIDINIUM-VILANTEROL 62.5-25 MCG/ACTUATION IN DSDV
1 | Freq: Every day | RESPIRATORY_TRACT | 11 refills | Status: CN
Start: 2017-12-14 — End: ?

## 2017-12-14 MED ORDER — UMECLIDINIUM-VILANTEROL 62.5-25 MCG/ACTUATION IN DSDV
1 | Freq: Every day | RESPIRATORY_TRACT | 11 refills | Status: AC
Start: 2017-12-14 — End: 2018-11-29

## 2017-12-14 MED ORDER — MUCUS CLEARING DEVICE MISC DEVI
0 refills | Status: CN
Start: 2017-12-14 — End: ?

## 2017-12-19 ENCOUNTER — Encounter: Admit: 2017-12-19 | Discharge: 2017-12-19

## 2017-12-21 ENCOUNTER — Encounter: Admit: 2017-12-21 | Discharge: 2017-12-21

## 2017-12-21 LAB — COMPREHENSIVE METABOLIC PANEL
Lab: 0.9
Lab: 1
Lab: 10
Lab: 103
Lab: 13
Lab: 130 — AB (ref 40–116)
Lab: 139
Lab: 21
Lab: 26
Lab: 29
Lab: 4
Lab: 5
Lab: 74
Lab: 8.3 — AB (ref 6.1–8.1)
Lab: 86

## 2017-12-21 LAB — CBC
Lab: 12
Lab: 14
Lab: 42
Lab: 6.6
Lab: 96

## 2017-12-22 ENCOUNTER — Encounter: Admit: 2017-12-22 | Discharge: 2017-12-22

## 2017-12-26 ENCOUNTER — Encounter: Admit: 2017-12-26 | Discharge: 2017-12-26

## 2017-12-26 ENCOUNTER — Inpatient Hospital Stay: Admit: 2017-12-26 | Discharge: 2017-12-26

## 2017-12-26 ENCOUNTER — Inpatient Hospital Stay: Admit: 2017-12-26 | Discharge: 2017-12-27

## 2017-12-26 DIAGNOSIS — I10 Essential (primary) hypertension: Principal | ICD-10-CM

## 2017-12-26 DIAGNOSIS — I4891 Unspecified atrial fibrillation: Principal | ICD-10-CM

## 2017-12-26 LAB — POC PT/INR: Lab: 1.2 MMOL/L — ABNORMAL HIGH (ref 0.8–1.2)

## 2017-12-26 MED ORDER — ALBUTEROL SULFATE 90 MCG/ACTUATION IN HFAA
1-2 | RESPIRATORY_TRACT | 0 refills | Status: DC | PRN
Start: 2017-12-26 — End: 2017-12-27

## 2017-12-26 MED ORDER — OXYCODONE 5 MG PO TAB
10 mg | Freq: Three times a day (TID) | ORAL | 0 refills | Status: DC
Start: 2017-12-26 — End: 2017-12-27
  Administered 2017-12-26 – 2017-12-27 (×3): 10 mg via ORAL

## 2017-12-26 MED ORDER — POTASSIUM CHLORIDE 20 MEQ PO TBTQ
40-60 meq | ORAL | 0 refills | Status: DC | PRN
Start: 2017-12-26 — End: 2017-12-27

## 2017-12-26 MED ORDER — SODIUM CHLORIDE 0.9 % IV SOLP
INTRAVENOUS | 0 refills | Status: DC
Start: 2017-12-26 — End: 2017-12-27
  Administered 2017-12-26: 14:00:00 1000 mL via INTRAVENOUS

## 2017-12-26 MED ORDER — (INV) DABIGATRAN ETEXILATE (HSC 143789) 150 MG ORAL TABLET
150 mg | Freq: Two times a day (BID) | ORAL | 0 refills | Status: DC
Start: 2017-12-26 — End: 2017-12-27
  Administered 2017-12-27: 14:00:00 150 mg via ORAL

## 2017-12-26 MED ORDER — PHENYLEPHRINE IV DRIP (STD CONC)
0 refills | Status: DC
Start: 2017-12-26 — End: 2017-12-26
  Administered 2017-12-26 (×2): 0.4 ug/kg/min via INTRAVENOUS

## 2017-12-26 MED ORDER — UMECLIDINIUM-VILANTEROL 62.5-25 MCG/ACTUATION IN DSDV
1 | Freq: Every day | RESPIRATORY_TRACT | 0 refills | Status: DC
Start: 2017-12-26 — End: 2017-12-27
  Administered 2017-12-27: 08:00:00 1 via RESPIRATORY_TRACT

## 2017-12-26 MED ORDER — ONDANSETRON HCL (PF) 4 MG/2 ML IJ SOLN
INTRAVENOUS | 0 refills | Status: DC
Start: 2017-12-26 — End: 2017-12-26
  Administered 2017-12-26: 17:00:00 4 mg via INTRAVENOUS

## 2017-12-26 MED ORDER — ASPIRIN 81 MG PO CHEW
81 mg | ORAL_TABLET | Freq: Every day | ORAL | 3 refills | Status: CN
Start: 2017-12-26 — End: ?

## 2017-12-26 MED ORDER — OXYCODONE SR 10 MG PO 12HR TABLET
10 mg | Freq: Once | ORAL | 0 refills | Status: DC
Start: 2017-12-26 — End: 2017-12-26

## 2017-12-26 MED ORDER — DABIGATRAN ETEXILATE 150 MG PO CAP
150 mg | Freq: Once | ORAL | 0 refills | Status: DC
Start: 2017-12-26 — End: 2017-12-26

## 2017-12-26 MED ORDER — HEPARIN (PORCINE) 1,000 UNIT/ML IJ SOLN
0 refills | Status: DC
Start: 2017-12-26 — End: 2017-12-26
  Administered 2017-12-26: 16:00:00 10000 [IU] via INTRAVENOUS

## 2017-12-26 MED ORDER — LISINOPRIL 10 MG PO TAB
10 mg | Freq: Every day | ORAL | 0 refills | Status: DC
Start: 2017-12-26 — End: 2017-12-27
  Administered 2017-12-27: 14:00:00 10 mg via ORAL

## 2017-12-26 MED ORDER — SUGAMMADEX 100 MG/ML IV SOLN
INTRAVENOUS | 0 refills | Status: DC
Start: 2017-12-26 — End: 2017-12-26

## 2017-12-26 MED ORDER — TEMAZEPAM 15 MG PO CAP
15 mg | Freq: Every evening | ORAL | 0 refills | Status: DC | PRN
Start: 2017-12-26 — End: 2017-12-27

## 2017-12-26 MED ORDER — KETAMINE 10 MG/ML IJ SOLN
0 refills | Status: DC
Start: 2017-12-26 — End: 2017-12-26
  Administered 2017-12-26: 16:00:00 20 mg via INTRAVENOUS

## 2017-12-26 MED ORDER — ACETAMINOPHEN 325 MG PO TAB
650 mg | ORAL | 0 refills | Status: DC | PRN
Start: 2017-12-26 — End: 2017-12-27

## 2017-12-26 MED ORDER — MAGNESIUM SULFATE IN D5W 1 GRAM/100 ML IV PGBK
1 g | INTRAVENOUS | 0 refills | Status: DC | PRN
Start: 2017-12-26 — End: 2017-12-27

## 2017-12-26 MED ORDER — POTASSIUM CHLORIDE 20 MEQ/15 ML PO LIQD
40-60 meq | NASOGASTRIC | 0 refills | Status: DC | PRN
Start: 2017-12-26 — End: 2017-12-27

## 2017-12-26 MED ORDER — DABIGATRAN ETEXILATE 150 MG PO CAP
150 mg | Freq: Two times a day (BID) | ORAL | 0 refills | Status: DC
Start: 2017-12-26 — End: 2017-12-26

## 2017-12-26 MED ORDER — MEPERIDINE (PF) 25 MG/ML IJ SYRG
12.5 mg | INTRAVENOUS | 0 refills | Status: DC | PRN
Start: 2017-12-26 — End: 2017-12-26

## 2017-12-26 MED ORDER — ALUMINUM-MAGNESIUM HYDROXIDE 200-200 MG/5 ML PO SUSP
30 mL | ORAL | 0 refills | Status: DC | PRN
Start: 2017-12-26 — End: 2017-12-27

## 2017-12-26 MED ORDER — POTASSIUM CHLORIDE IN WATER 10 MEQ/50 ML IV PGBK
10 meq | INTRAVENOUS | 0 refills | Status: DC | PRN
Start: 2017-12-26 — End: 2017-12-27

## 2017-12-26 MED ORDER — PROPOFOL INJ 10 MG/ML IV VIAL
0 refills | Status: DC
Start: 2017-12-26 — End: 2017-12-26
  Administered 2017-12-26: 16:00:00 50 mg via INTRAVENOUS
  Administered 2017-12-26: 16:00:00 30 mg via INTRAVENOUS
  Administered 2017-12-26: 15:00:00 70 mg via INTRAVENOUS

## 2017-12-26 MED ORDER — DEXAMETHASONE SODIUM PHOSPHATE 4 MG/ML IJ SOLN
INTRAVENOUS | 0 refills | Status: DC
Start: 2017-12-26 — End: 2017-12-26
  Administered 2017-12-26: 16:00:00 4 mg via INTRAVENOUS

## 2017-12-26 MED ORDER — CYCLOBENZAPRINE 10 MG PO TAB
10 mg | Freq: Two times a day (BID) | ORAL | 0 refills | Status: DC | PRN
Start: 2017-12-26 — End: 2017-12-27
  Administered 2017-12-26: 18:00:00 10 mg via ORAL

## 2017-12-26 MED ORDER — DULOXETINE 60 MG PO CPDR
60 mg | Freq: Every day | ORAL | 0 refills | Status: DC
Start: 2017-12-26 — End: 2017-12-27
  Administered 2017-12-27: 14:00:00 60 mg via ORAL

## 2017-12-26 MED ORDER — FENTANYL CITRATE (PF) 50 MCG/ML IJ SOLN
0 refills | Status: DC
Start: 2017-12-26 — End: 2017-12-26
  Administered 2017-12-26: 15:00:00 50 ug via INTRAVENOUS

## 2017-12-26 MED ORDER — FENTANYL CITRATE (PF) 50 MCG/ML IJ SOLN
25 ug | INTRAVENOUS | 0 refills | Status: DC | PRN
Start: 2017-12-26 — End: 2017-12-27
  Administered 2017-12-26 (×4): 25 ug via INTRAVENOUS

## 2017-12-26 MED ORDER — MAGNESIUM CHLORIDE 64 MG PO TBER
535 mg | ORAL | 0 refills | Status: DC | PRN
Start: 2017-12-26 — End: 2017-12-27

## 2017-12-26 MED ORDER — APIXABAN 5 MG PO TAB
5 mg | Freq: Two times a day (BID) | ORAL | 0 refills | Status: CP
Start: 2017-12-26 — End: ?
  Administered 2017-12-26: 21:00:00 5 mg via ORAL

## 2017-12-26 MED ORDER — METOPROLOL TARTRATE 25 MG PO TAB
100 mg | Freq: Two times a day (BID) | ORAL | 0 refills | Status: DC
Start: 2017-12-26 — End: 2017-12-27
  Administered 2017-12-27 (×2): 100 mg via ORAL

## 2017-12-26 MED ORDER — ROCURONIUM 10 MG/ML IV SOLN
INTRAVENOUS | 0 refills | Status: DC
Start: 2017-12-26 — End: 2017-12-26
  Administered 2017-12-26: 15:00:00 25 mg via INTRAVENOUS

## 2017-12-26 MED ORDER — LIPASE-PROTEASE-AMYLASE (CREON 24,000) 24,000-76,000- 120,000 UNIT PO CPDR
3 | Freq: Three times a day (TID) | ORAL | 0 refills | Status: DC
Start: 2017-12-26 — End: 2017-12-27
  Administered 2017-12-26 – 2017-12-27 (×3): 3 via ORAL

## 2017-12-26 MED ORDER — (INV) DABIGATRAN ETEXILATE (HSC 143789) 150 MG ORAL TABLET
150 mg | Freq: Two times a day (BID) | ORAL | 0 refills | Status: DC
Start: 2017-12-26 — End: 2017-12-26

## 2017-12-26 MED ORDER — LIDOCAINE (PF) 10 MG/ML (1 %) IJ SOLN
.1-2 mL | INTRAMUSCULAR | 0 refills | Status: DC | PRN
Start: 2017-12-26 — End: 2017-12-26

## 2017-12-26 MED ORDER — ASPIRIN 81 MG PO CHEW
81 mg | Freq: Every day | ORAL | 0 refills | Status: DC
Start: 2017-12-26 — End: 2017-12-27
  Administered 2017-12-27: 14:00:00 81 mg via ORAL

## 2017-12-26 MED ORDER — ASPIRIN 325 MG PO TAB
325 mg | Freq: Once | ORAL | 0 refills | Status: CP
Start: 2017-12-26 — End: ?
  Administered 2017-12-26: 15:00:00 325 mg via ORAL

## 2017-12-26 MED ORDER — PROMETHAZINE 25 MG/ML IJ SOLN
6.25 mg | INTRAVENOUS | 0 refills | Status: DC | PRN
Start: 2017-12-26 — End: 2017-12-26

## 2017-12-26 MED ORDER — HYDROCODONE-ACETAMINOPHEN 5-325 MG PO TAB
1-2 | ORAL | 0 refills | Status: DC | PRN
Start: 2017-12-26 — End: 2017-12-26

## 2017-12-26 MED ORDER — FENTANYL CITRATE (PF) 50 MCG/ML IJ SOLN
25 ug | INTRAVENOUS | 0 refills | Status: DC | PRN
Start: 2017-12-26 — End: 2017-12-26

## 2017-12-26 MED ORDER — GABAPENTIN 300 MG PO CAP
300 mg | ORAL | 0 refills | Status: DC
Start: 2017-12-26 — End: 2017-12-27
  Administered 2017-12-26 – 2017-12-27 (×3): 300 mg via ORAL

## 2017-12-26 MED ORDER — DILTIAZEM HCL 240 MG PO CP24
240 mg | Freq: Every day | ORAL | 0 refills | Status: DC
Start: 2017-12-26 — End: 2017-12-27
  Administered 2017-12-27: 14:00:00 240 mg via ORAL

## 2017-12-26 MED ORDER — LIDOCAINE (PF) 200 MG/10 ML (2 %) IJ SYRG
0 refills | Status: DC
Start: 2017-12-26 — End: 2017-12-26
  Administered 2017-12-26: 15:00:00 60 mg via INTRAVENOUS

## 2017-12-26 MED ORDER — DOCUSATE SODIUM 100 MG PO CAP
100 mg | Freq: Every day | ORAL | 0 refills | Status: DC | PRN
Start: 2017-12-26 — End: 2017-12-27

## 2017-12-26 MED ORDER — DABIGATRAN ETEXILATE 150 MG PO CAP
150 mg | ORAL_CAPSULE | Freq: Two times a day (BID) | ORAL | 3 refills | Status: CN
Start: 2017-12-26 — End: ?

## 2017-12-26 MED ORDER — DEXTRAN 70-HYPROMELLOSE (PF) 0.1-0.3 % OP DPET
0 refills | Status: DC
Start: 2017-12-26 — End: 2017-12-26
  Administered 2017-12-26: 16:00:00 2 [drp] via OPHTHALMIC

## 2017-12-26 MED ORDER — LIDOCAINE HCL 2 % MM JELP
Freq: Once | TOPICAL | 0 refills | Status: DC
Start: 2017-12-26 — End: 2017-12-26

## 2017-12-27 ENCOUNTER — Encounter: Admit: 2017-12-27 | Discharge: 2017-12-27

## 2017-12-27 ENCOUNTER — Inpatient Hospital Stay: Admit: 2017-12-27 | Discharge: 2017-12-28

## 2017-12-27 ENCOUNTER — Inpatient Hospital Stay
Admit: 2017-12-26 | Discharge: 2017-12-27 | Disposition: A | Discharge status: Home / Self Care | Payer: MEDICAID | Attending: Cardiovascular Disease | Admitting: Cardiovascular Disease

## 2017-12-27 ENCOUNTER — Inpatient Hospital Stay: Admit: 2017-12-01 | Discharge: 2017-12-01

## 2017-12-27 ENCOUNTER — Inpatient Hospital Stay: Admit: 2017-12-27 | Discharge: 2017-12-27

## 2017-12-27 DIAGNOSIS — I4819 Other persistent atrial fibrillation: Principal | ICD-10-CM

## 2017-12-27 DIAGNOSIS — Z8619 Personal history of other infectious and parasitic diseases: ICD-10-CM

## 2017-12-27 DIAGNOSIS — I10 Essential (primary) hypertension: ICD-10-CM

## 2017-12-27 DIAGNOSIS — Z006 Encounter for examination for normal comparison and control in clinical research program: ICD-10-CM

## 2017-12-27 DIAGNOSIS — J449 Chronic obstructive pulmonary disease, unspecified: ICD-10-CM

## 2017-12-27 LAB — POC ACTIVATED CLOTTING TIME: Lab: 251 s

## 2017-12-27 LAB — CBC: Lab: 9.3 K/UL — ABNORMAL LOW (ref >60)

## 2017-12-27 MED ORDER — (INV) DABIGATRAN ETEXILATE (HSC 143789) 150 MG ORAL TABLET
150 mg | Freq: Two times a day (BID) | ORAL | 0 refills | Status: SS
Start: 2017-12-27 — End: 2018-04-24

## 2017-12-27 MED ORDER — ASPIRIN 81 MG PO CHEW
81 mg | ORAL_TABLET | Freq: Every day | ORAL | 3 refills | Status: AC
Start: 2017-12-27 — End: ?

## 2017-12-27 MED ORDER — OXYCODONE 5 MG PO TAB
10 mg | Freq: Once | ORAL | 0 refills | Status: CP
Start: 2017-12-27 — End: ?
  Administered 2017-12-27: 15:00:00 10 mg via ORAL

## 2017-12-28 ENCOUNTER — Encounter: Admit: 2017-12-28 | Discharge: 2017-12-28

## 2017-12-28 DIAGNOSIS — I10 Essential (primary) hypertension: Principal | ICD-10-CM

## 2017-12-28 DIAGNOSIS — K759 Inflammatory liver disease, unspecified: ICD-10-CM

## 2017-12-28 DIAGNOSIS — R079 Chest pain, unspecified: ICD-10-CM

## 2017-12-28 DIAGNOSIS — I4891 Unspecified atrial fibrillation: ICD-10-CM

## 2017-12-28 DIAGNOSIS — J449 Chronic obstructive pulmonary disease, unspecified: ICD-10-CM

## 2017-12-28 DIAGNOSIS — Z8719 Personal history of other diseases of the digestive system: ICD-10-CM

## 2017-12-29 ENCOUNTER — Encounter: Admit: 2017-12-29 | Discharge: 2017-12-29

## 2017-12-29 DIAGNOSIS — Z8719 Personal history of other diseases of the digestive system: ICD-10-CM

## 2017-12-29 DIAGNOSIS — I4891 Unspecified atrial fibrillation: ICD-10-CM

## 2017-12-29 DIAGNOSIS — I10 Essential (primary) hypertension: Principal | ICD-10-CM

## 2017-12-29 DIAGNOSIS — J449 Chronic obstructive pulmonary disease, unspecified: ICD-10-CM

## 2017-12-29 DIAGNOSIS — R079 Chest pain, unspecified: ICD-10-CM

## 2017-12-29 DIAGNOSIS — K759 Inflammatory liver disease, unspecified: ICD-10-CM

## 2018-01-18 ENCOUNTER — Encounter: Admit: 2018-01-18 | Discharge: 2018-01-18

## 2018-01-22 ENCOUNTER — Encounter: Admit: 2018-01-22 | Discharge: 2018-01-22

## 2018-01-22 DIAGNOSIS — K759 Inflammatory liver disease, unspecified: ICD-10-CM

## 2018-01-22 DIAGNOSIS — I10 Essential (primary) hypertension: Principal | ICD-10-CM

## 2018-01-22 DIAGNOSIS — R079 Chest pain, unspecified: ICD-10-CM

## 2018-01-22 DIAGNOSIS — I4891 Unspecified atrial fibrillation: ICD-10-CM

## 2018-01-22 DIAGNOSIS — J189 Pneumonia, unspecified organism: ICD-10-CM

## 2018-01-22 DIAGNOSIS — J449 Chronic obstructive pulmonary disease, unspecified: ICD-10-CM

## 2018-01-22 DIAGNOSIS — Z8719 Personal history of other diseases of the digestive system: ICD-10-CM

## 2018-01-22 LAB — COMPREHENSIVE METABOLIC PANEL
Lab: 0.5 mg/dL (ref 0.3–1.2)
Lab: 0.9 mg/dL — ABNORMAL LOW (ref 0.4–1.24)
Lab: 101 U/L (ref 25–110)
Lab: 107 MMOL/L — ABNORMAL HIGH (ref 98–110)
Lab: 11 U/L (ref 7–56)
Lab: 139 MMOL/L — ABNORMAL LOW (ref 137–147)
Lab: 15 mg/dL — ABNORMAL HIGH (ref 7–25)
Lab: 3.9 g/dL (ref 3.5–5.0)
Lab: 4 MMOL/L (ref 3.5–5.1)
Lab: 5 K/UL (ref 3–12)
Lab: 6.8 g/dL (ref 6.0–8.0)
Lab: 88 mg/dL (ref 70–100)
Lab: 9 mg/dL (ref 8.5–10.6)
Lab: >60 mL/min (ref >60)
Lab: >60 mL/min (ref >60)

## 2018-01-22 LAB — CBC AND DIFF: Lab: 6.4 K/UL (ref 4.5–11.0)

## 2018-01-22 LAB — PTT (APTT): Lab: 57 s — ABNORMAL HIGH (ref 24.0–36.5)

## 2018-01-22 LAB — PROTIME INR (PT): Lab: 1.5 M/UL — ABNORMAL HIGH (ref 0.8–1.2)

## 2018-01-22 LAB — POC LACTATE: Lab: 0.8 MMOL/L (ref 0.5–2.0)

## 2018-01-22 MED ORDER — DILTIAZEM HCL 120 MG PO CP24
240 mg | Freq: Every day | ORAL | 0 refills | Status: DC
Start: 2018-01-22 — End: 2018-01-23
  Administered 2018-01-23: 14:00:00 240 mg via ORAL

## 2018-01-22 MED ORDER — ALBUTEROL SULFATE 90 MCG/ACTUATION IN HFAA
1-2 | RESPIRATORY_TRACT | 0 refills | Status: DC | PRN
Start: 2018-01-22 — End: 2018-01-23

## 2018-01-22 MED ORDER — OXYCODONE 10 MG PO TAB
20 mg | Freq: Two times a day (BID) | ORAL | 0 refills | Status: DC
Start: 2018-01-22 — End: 2018-01-23
  Administered 2018-01-23 (×2): 20 mg via ORAL

## 2018-01-22 MED ORDER — GABAPENTIN 300 MG PO CAP
300 mg | ORAL | 0 refills | Status: DC | PRN
Start: 2018-01-22 — End: 2018-01-23

## 2018-01-22 MED ORDER — PANTOPRAZOLE 40 MG IV SOLR
40 mg | Freq: Two times a day (BID) | INTRAVENOUS | 0 refills | Status: DC
Start: 2018-01-22 — End: 2018-01-23
  Administered 2018-01-23: 18:00:00 40 mg via INTRAVENOUS

## 2018-01-22 MED ORDER — LIPASE-PROTEASE-AMYLASE (CREON 24,000) 24,000-76,000- 120,000 UNIT PO CPDR
3 | Freq: Three times a day (TID) | ORAL | 0 refills | Status: DC
Start: 2018-01-22 — End: 2018-01-23

## 2018-01-22 MED ORDER — PANTOPRAZOLE 40 MG IV SOLR
80 mg | Freq: Once | INTRAVENOUS | 0 refills | Status: CP
Start: 2018-01-22 — End: ?
  Administered 2018-01-22: 20:00:00 80 mg via INTRAVENOUS

## 2018-01-22 MED ORDER — LISINOPRIL 10 MG PO TAB
10 mg | Freq: Every day | ORAL | 0 refills | Status: DC
Start: 2018-01-22 — End: 2018-01-23
  Administered 2018-01-23: 14:00:00 10 mg via ORAL

## 2018-01-22 MED ORDER — IBUPROFEN 400 MG PO TAB
200 mg | ORAL | 0 refills | Status: DC | PRN
Start: 2018-01-22 — End: 2018-01-23

## 2018-01-22 MED ORDER — CYCLOBENZAPRINE 10 MG PO TAB
10 mg | Freq: Three times a day (TID) | ORAL | 0 refills | Status: DC | PRN
Start: 2018-01-22 — End: 2018-01-23

## 2018-01-22 MED ORDER — SODIUM CHLORIDE 0.9 % IJ SOLN
50 mL | Freq: Once | INTRAVENOUS | 0 refills | Status: CP
Start: 2018-01-22 — End: ?
  Administered 2018-01-23: 01:00:00 50 mL via INTRAVENOUS

## 2018-01-22 MED ORDER — IOHEXOL 350 MG IODINE/ML IV SOLN
70 mL | Freq: Once | INTRAVENOUS | 0 refills | Status: CP
Start: 2018-01-22 — End: ?
  Administered 2018-01-23: 01:00:00 70 mL via INTRAVENOUS

## 2018-01-22 MED ORDER — UMECLIDINIUM-VILANTEROL 62.5-25 MCG/ACTUATION IN DSDV
1 | Freq: Every day | RESPIRATORY_TRACT | 0 refills | Status: DC
Start: 2018-01-22 — End: 2018-01-23
  Administered 2018-01-23: 12:00:00 1 via RESPIRATORY_TRACT

## 2018-01-22 MED ORDER — METOPROLOL TARTRATE 100 MG PO TAB
100 mg | Freq: Two times a day (BID) | ORAL | 0 refills | Status: DC
Start: 2018-01-22 — End: 2018-01-23
  Administered 2018-01-23 (×2): 100 mg via ORAL

## 2018-01-22 MED ORDER — DULOXETINE 60 MG PO CPDR
60 mg | Freq: Every day | ORAL | 0 refills | Status: DC
Start: 2018-01-22 — End: 2018-01-23
  Administered 2018-01-23: 14:00:00 60 mg via ORAL

## 2018-01-23 ENCOUNTER — Encounter: Admit: 2018-01-23 | Discharge: 2018-01-23

## 2018-01-23 ENCOUNTER — Inpatient Hospital Stay: Admit: 2018-01-22 | Discharge: 2018-01-22

## 2018-01-23 ENCOUNTER — Emergency Department: Admit: 2018-01-22 | Discharge: 2018-01-22

## 2018-01-23 ENCOUNTER — Observation Stay: Admit: 2018-01-22 | Discharge: 2018-01-23 | Payer: MEDICAID

## 2018-01-23 DIAGNOSIS — G8929 Other chronic pain: ICD-10-CM

## 2018-01-23 DIAGNOSIS — K920 Hematemesis: ICD-10-CM

## 2018-01-23 DIAGNOSIS — I4891 Unspecified atrial fibrillation: ICD-10-CM

## 2018-01-23 DIAGNOSIS — B182 Chronic viral hepatitis C: ICD-10-CM

## 2018-01-23 DIAGNOSIS — F17210 Nicotine dependence, cigarettes, uncomplicated: ICD-10-CM

## 2018-01-23 DIAGNOSIS — J449 Chronic obstructive pulmonary disease, unspecified: ICD-10-CM

## 2018-01-23 DIAGNOSIS — Z0489 Encounter for examination and observation for other specified reasons: ICD-10-CM

## 2018-01-23 DIAGNOSIS — Z7982 Long term (current) use of aspirin: ICD-10-CM

## 2018-01-23 DIAGNOSIS — Z7901 Long term (current) use of anticoagulants: ICD-10-CM

## 2018-01-23 DIAGNOSIS — R042 Hemoptysis: Principal | ICD-10-CM

## 2018-01-23 DIAGNOSIS — I10 Essential (primary) hypertension: ICD-10-CM

## 2018-01-23 DIAGNOSIS — Z880 Allergy status to penicillin: ICD-10-CM

## 2018-01-23 LAB — CBC: Lab: 7.1 K/UL — ABNORMAL HIGH (ref >60)

## 2018-01-23 LAB — COMPREHENSIVE METABOLIC PANEL: Lab: 144 MMOL/L — ABNORMAL LOW (ref 137–147)

## 2018-01-23 MED ORDER — OXYCODONE 20 MG PO TAB
20 mg | ORAL_TABLET | Freq: Two times a day (BID) | ORAL | 0 refills | 6.00000 days | Status: AC
Start: 2018-01-23 — End: ?
  Filled 2018-01-23 (×2): qty 6, 3d supply, fill #1

## 2018-01-23 MED ORDER — OMEPRAZOLE 20 MG PO CPDR
20 mg | ORAL_CAPSULE | Freq: Every day | ORAL | 0 refills | Status: SS
Start: 2018-01-23 — End: 2018-04-24

## 2018-01-23 MED ORDER — AZITHROMYCIN 250 MG PO TAB
ORAL_TABLET | Freq: Every day | ORAL | 0 refills | Status: AC
Start: 2018-01-23 — End: ?
  Filled 2018-01-23 (×2): qty 6, 5d supply, fill #1

## 2018-01-23 MED FILL — OMEPRAZOLE 20 MG PO CPDR: 20 mg | ORAL | 30 days supply | Qty: 90 | Fill #1 | Status: CP

## 2018-02-08 ENCOUNTER — Encounter: Admit: 2018-02-08 | Discharge: 2018-02-08

## 2018-02-08 ENCOUNTER — Ambulatory Visit: Admit: 2018-02-21 | Discharge: 2018-02-21 | Payer: MEDICAID

## 2018-02-21 ENCOUNTER — Encounter: Admit: 2018-02-21 | Discharge: 2018-02-21

## 2018-02-21 ENCOUNTER — Ambulatory Visit: Admit: 2018-02-21 | Discharge: 2018-02-21

## 2018-02-21 DIAGNOSIS — J449 Chronic obstructive pulmonary disease, unspecified: ICD-10-CM

## 2018-02-21 DIAGNOSIS — I4891 Unspecified atrial fibrillation: ICD-10-CM

## 2018-02-21 DIAGNOSIS — Z880 Allergy status to penicillin: ICD-10-CM

## 2018-02-21 DIAGNOSIS — I10 Essential (primary) hypertension: ICD-10-CM

## 2018-02-21 DIAGNOSIS — K920 Hematemesis: Principal | ICD-10-CM

## 2018-02-21 DIAGNOSIS — F17210 Nicotine dependence, cigarettes, uncomplicated: ICD-10-CM

## 2018-02-21 MED ORDER — SODIUM CHLORIDE 0.9 % IV SOLP
0 refills | Status: DC
Start: 2018-02-21 — End: 2018-02-21
  Administered 2018-02-21: 15:00:00 via INTRAVENOUS

## 2018-02-21 MED ORDER — PROPOFOL INJ 10 MG/ML IV VIAL
0 refills | Status: DC
Start: 2018-02-21 — End: 2018-02-21
  Administered 2018-02-21: 15:00:00 30 mg via INTRAVENOUS

## 2018-02-21 MED ORDER — TAP WATER/SIMETHICONE IRRIGATION
0 refills | Status: DC
Start: 2018-02-21 — End: 2018-02-21
  Administered 2018-02-21: 15:00:00 60 mL

## 2018-02-21 MED ORDER — PROPOFOL 10 MG/ML IV EMUL 20 ML (INFUSION)(AM)(OR)
INTRAVENOUS | 0 refills | Status: DC
Start: 2018-02-21 — End: 2018-02-21
  Administered 2018-02-21: 15:00:00 110 ug/kg/min via INTRAVENOUS

## 2018-02-21 MED ORDER — LIDOCAINE (PF) 200 MG/10 ML (2 %) IJ SYRG
0 refills | Status: DC
Start: 2018-02-21 — End: 2018-02-21
  Administered 2018-02-21: 15:00:00 65 mg via INTRAVENOUS

## 2018-02-22 ENCOUNTER — Encounter: Admit: 2018-02-22 | Discharge: 2018-02-22

## 2018-02-22 DIAGNOSIS — R079 Chest pain, unspecified: ICD-10-CM

## 2018-02-22 DIAGNOSIS — Z8719 Personal history of other diseases of the digestive system: ICD-10-CM

## 2018-02-22 DIAGNOSIS — J449 Chronic obstructive pulmonary disease, unspecified: ICD-10-CM

## 2018-02-22 DIAGNOSIS — I4891 Unspecified atrial fibrillation: ICD-10-CM

## 2018-02-22 DIAGNOSIS — I10 Essential (primary) hypertension: Principal | ICD-10-CM

## 2018-02-22 DIAGNOSIS — K759 Inflammatory liver disease, unspecified: ICD-10-CM

## 2018-03-24 ENCOUNTER — Encounter: Admit: 2018-03-24 | Discharge: 2018-03-24

## 2018-03-27 ENCOUNTER — Ambulatory Visit: Admit: 2018-03-27 | Discharge: 2018-03-27

## 2018-03-27 ENCOUNTER — Encounter: Admit: 2018-03-27 | Discharge: 2018-03-27

## 2018-03-27 ENCOUNTER — Ambulatory Visit: Admit: 2018-03-27 | Discharge: 2018-03-27 | Payer: MEDICAID

## 2018-03-27 DIAGNOSIS — Z006 Encounter for examination for normal comparison and control in clinical research program: Secondary | ICD-10-CM

## 2018-03-27 DIAGNOSIS — I4891 Unspecified atrial fibrillation: Secondary | ICD-10-CM

## 2018-03-27 DIAGNOSIS — I10 Essential (primary) hypertension: Secondary | ICD-10-CM

## 2018-03-27 DIAGNOSIS — Z8719 Personal history of other diseases of the digestive system: Secondary | ICD-10-CM

## 2018-03-27 DIAGNOSIS — I482 Chronic atrial fibrillation, unspecified: Secondary | ICD-10-CM

## 2018-03-27 DIAGNOSIS — Z95818 Presence of other cardiac implants and grafts: Secondary | ICD-10-CM

## 2018-03-27 DIAGNOSIS — J449 Chronic obstructive pulmonary disease, unspecified: Secondary | ICD-10-CM

## 2018-03-27 DIAGNOSIS — R079 Chest pain, unspecified: Secondary | ICD-10-CM

## 2018-03-27 DIAGNOSIS — K759 Inflammatory liver disease, unspecified: Secondary | ICD-10-CM

## 2018-03-27 MED ORDER — SODIUM CHLORIDE 0.9 % IV SOLP (OR) 500ML
0 refills | Status: DC
Start: 2018-03-27 — End: 2018-03-27
  Administered 2018-03-27: 16:00:00 via INTRAVENOUS

## 2018-03-27 MED ORDER — LIDOCAINE (PF) 200 MG/10 ML (2 %) IJ SYRG
0 refills | Status: DC
Start: 2018-03-27 — End: 2018-03-27
  Administered 2018-03-27: 16:00:00 60 mg via INTRAVENOUS

## 2018-03-27 MED ORDER — PROPOFOL INJ 10 MG/ML IV VIAL
0 refills | Status: DC
Start: 2018-03-27 — End: 2018-03-27
  Administered 2018-03-27: 16:00:00 30 mg via INTRAVENOUS
  Administered 2018-03-27 (×3): 20 mg via INTRAVENOUS

## 2018-03-27 MED ORDER — PROPOFOL 10 MG/ML IV EMUL 20 ML (INFUSION)(AM)(OR)
INTRAVENOUS | 0 refills | Status: DC
Start: 2018-03-27 — End: 2018-03-27
  Administered 2018-03-27: 16:00:00 110 ug/kg/min via INTRAVENOUS

## 2018-03-28 ENCOUNTER — Encounter: Admit: 2018-03-28 | Discharge: 2018-03-28

## 2018-04-07 ENCOUNTER — Encounter: Admit: 2018-04-07 | Discharge: 2018-04-07

## 2018-04-20 ENCOUNTER — Encounter: Admit: 2018-04-20 | Discharge: 2018-04-20

## 2018-04-20 DIAGNOSIS — K920 Hematemesis: ICD-10-CM

## 2018-04-20 MED ORDER — PANTOPRAZOLE 40 MG IV SOLR
80 mg | Freq: Once | INTRAVENOUS | 0 refills | Status: CP
Start: 2018-04-20 — End: ?
  Administered 2018-04-21: 07:00:00 80 mg via INTRAVENOUS

## 2018-04-20 MED ORDER — IOHEXOL 350 MG IODINE/ML IV SOLN
80 mL | Freq: Once | INTRAVENOUS | 0 refills | Status: CP
Start: 2018-04-20 — End: ?
  Administered 2018-04-21: 06:00:00 80 mL via INTRAVENOUS

## 2018-04-20 MED ORDER — SODIUM CHLORIDE 0.9 % IJ SOLN
50 mL | Freq: Once | INTRAVENOUS | 0 refills | Status: CP
Start: 2018-04-20 — End: ?
  Administered 2018-04-21: 06:00:00 50 mL via INTRAVENOUS

## 2018-04-21 ENCOUNTER — Inpatient Hospital Stay: Admit: 2018-04-21 | Discharge: 2018-04-21

## 2018-04-21 ENCOUNTER — Encounter: Admit: 2018-04-21 | Discharge: 2018-04-21

## 2018-04-21 ENCOUNTER — Inpatient Hospital Stay: Admit: 2018-04-21 | Discharge: 2018-04-25 | Disposition: A | Discharge status: Home / Self Care | Payer: MEDICAID

## 2018-04-21 DIAGNOSIS — R042 Hemoptysis: Principal | ICD-10-CM

## 2018-04-21 LAB — POC LACTATE: Lab: 0.7 MMOL/L (ref 0.5–2.0)

## 2018-04-21 LAB — COMPREHENSIVE METABOLIC PANEL
Lab: 0.4 mg/dL (ref 0.3–1.2)
Lab: 1 mg/dL (ref 0.4–1.24)
Lab: 14 mg/dL (ref 7–25)
Lab: 141 MMOL/L — ABNORMAL LOW (ref 137–147)
Lab: 18 U/L (ref 7–40)
Lab: 3.6 g/dL (ref 3.5–5.0)
Lab: 6.4 g/dL (ref 6.0–8.0)
Lab: 9 K/UL (ref 3–12)
Lab: 9 U/L (ref 7–56)
Lab: 9.2 mg/dL — ABNORMAL HIGH (ref 8.5–10.6)
Lab: 99 U/L (ref 25–110)
Lab: >60 mL/min (ref >60)
Lab: >60 mL/min (ref >60)
Lab: 0.4 mg/dL (ref 0.3–1.2)
Lab: 0.7 mg/dL (ref 0.4–1.24)
Lab: 113 MMOL/L — ABNORMAL HIGH (ref 98–110)
Lab: 13 mg/dL (ref 7–25)
Lab: 20 MMOL/L — ABNORMAL LOW (ref 21–30)
Lab: 3.3 MMOL/L — ABNORMAL LOW (ref 3.5–5.1)
Lab: 5 g/dL — ABNORMAL LOW (ref 6.0–8.0)
Lab: 6 U/L — ABNORMAL LOW (ref 7–56)
Lab: 7 mg/dL — ABNORMAL LOW (ref 8.5–10.6)
Lab: 75 U/L (ref 25–110)
Lab: 78 mg/dL (ref 70–100)
Lab: 9 10*3/uL (ref 3–12)
Lab: >60 mL/min — ABNORMAL LOW (ref >60)

## 2018-04-21 LAB — CBC AND DIFF
Lab: 0.1 10*3/uL (ref 0–0.20)
Lab: 0.2 10*3/uL (ref 0–0.45)
Lab: 2 % (ref 0–5)
Lab: 9.5 10*3/uL (ref 4.5–11.0)
Lab: 96 FL (ref 80–100)
Lab: 0.1 K/UL — ABNORMAL HIGH (ref 0–0.20)
Lab: 2.7 M/UL — ABNORMAL LOW (ref 4.4–5.5)
Lab: 52 % — ABNORMAL LOW (ref 41–77)
Lab: 6.7 10*3/uL (ref 4.5–11.0)
Lab: 8 % (ref 4–12)

## 2018-04-21 LAB — PTT (APTT): Lab: 29 s — ABNORMAL LOW (ref 24.0–36.5)

## 2018-04-21 LAB — PROTIME INR (PT): Lab: 1.3 MMOL/L — ABNORMAL HIGH (ref 0.8–1.2)

## 2018-04-21 MED ORDER — POTASSIUM CHLORIDE 20 MEQ PO TBTQ
40 meq | Freq: Once | ORAL | 0 refills | Status: CP
Start: 2018-04-21 — End: ?
  Administered 2018-04-21: 16:00:00 40 meq via ORAL

## 2018-04-21 MED ORDER — ASPIRIN 81 MG PO CHEW
81 mg | Freq: Every day | ORAL | 0 refills | Status: DC
Start: 2018-04-21 — End: 2018-04-25
  Administered 2018-04-21 – 2018-04-25 (×5): 81 mg via ORAL

## 2018-04-21 MED ORDER — PANTOPRAZOLE 40 MG IV SOLR
40 mg | Freq: Two times a day (BID) | INTRAVENOUS | 0 refills | Status: DC
Start: 2018-04-21 — End: 2018-04-25
  Administered 2018-04-21 – 2018-04-25 (×7): 40 mg via INTRAVENOUS

## 2018-04-21 MED ORDER — POLYETHYLENE GLYCOL 3350 17 GRAM PO PWPK
1 | Freq: Every day | ORAL | 0 refills | Status: DC | PRN
Start: 2018-04-21 — End: 2018-04-25
  Administered 2018-04-25: 14:00:00 17 g via ORAL

## 2018-04-21 MED ORDER — DULOXETINE 60 MG PO CPDR
60 mg | Freq: Every day | ORAL | 0 refills | Status: DC
Start: 2018-04-21 — End: 2018-04-25
  Administered 2018-04-21 – 2018-04-25 (×4): 60 mg via ORAL

## 2018-04-21 MED ORDER — UMECLIDINIUM-VILANTEROL 62.5-25 MCG/ACTUATION IN DSDV
1 | Freq: Every day | RESPIRATORY_TRACT | 0 refills | Status: DC
Start: 2018-04-21 — End: 2018-04-25
  Administered 2018-04-21: 12:00:00 1 via RESPIRATORY_TRACT

## 2018-04-21 MED ORDER — ALBUTEROL SULFATE 90 MCG/ACTUATION IN HFAA
1-2 | RESPIRATORY_TRACT | 0 refills | Status: DC | PRN
Start: 2018-04-21 — End: 2018-04-25

## 2018-04-21 MED ORDER — LIPASE-PROTEASE-AMYLASE (CREON 24,000) 24,000-76,000- 120,000 UNIT PO CPDR
3 | Freq: Three times a day (TID) | ORAL | 0 refills | Status: DC
Start: 2018-04-21 — End: 2018-04-25
  Administered 2018-04-25: 14:00:00 3 via ORAL

## 2018-04-21 MED ORDER — MELATONIN 3 MG PO TAB
3 mg | Freq: Every evening | ORAL | 0 refills | Status: DC | PRN
Start: 2018-04-21 — End: 2018-04-25

## 2018-04-21 MED ORDER — NICOTINE 14 MG/24 HR TD PT24
1 | Freq: Every day | TRANSDERMAL | 0 refills | Status: DC
Start: 2018-04-21 — End: 2018-04-25
  Administered 2018-04-21 – 2018-04-25 (×2): 1 via TRANSDERMAL

## 2018-04-21 MED ORDER — ACETAMINOPHEN 325 MG PO TAB
650 mg | ORAL | 0 refills | Status: DC | PRN
Start: 2018-04-21 — End: 2018-04-25
  Administered 2018-04-22 – 2018-04-25 (×3): 650 mg via ORAL

## 2018-04-21 MED ORDER — ONDANSETRON HCL 4 MG PO TAB
4 mg | ORAL | 0 refills | Status: DC | PRN
Start: 2018-04-21 — End: 2018-04-25

## 2018-04-21 MED ORDER — HYDRALAZINE 20 MG/ML IJ SOLN
5 mg | INTRAVENOUS | 0 refills | Status: DC | PRN
Start: 2018-04-21 — End: 2018-04-25

## 2018-04-21 MED ORDER — DILTIAZEM HCL 240 MG PO CP24
240 mg | Freq: Every day | ORAL | 0 refills | Status: DC
Start: 2018-04-21 — End: 2018-04-25
  Administered 2018-04-21 – 2018-04-25 (×5): 240 mg via ORAL

## 2018-04-21 MED ORDER — PATCH DOCUMENTATION - NICOTINE 14 MG/24HR
Freq: Two times a day (BID) | TRANSDERMAL | 0 refills | Status: DC
Start: 2018-04-21 — End: 2018-04-25

## 2018-04-21 MED ORDER — METOPROLOL TARTRATE 50 MG PO TAB
50 mg | Freq: Two times a day (BID) | ORAL | 0 refills | Status: DC
Start: 2018-04-21 — End: 2018-04-25
  Administered 2018-04-21 – 2018-04-25 (×8): 50 mg via ORAL

## 2018-04-21 MED ORDER — ONDANSETRON HCL (PF) 4 MG/2 ML IJ SOLN
4 mg | INTRAVENOUS | 0 refills | Status: DC | PRN
Start: 2018-04-21 — End: 2018-04-25
  Administered 2018-04-22: 04:00:00 4 mg via INTRAVENOUS

## 2018-04-21 MED ORDER — OXYCODONE 5 MG PO TAB
10-20 mg | Freq: Three times a day (TID) | ORAL | 0 refills | Status: DC | PRN
Start: 2018-04-21 — End: 2018-04-22
  Administered 2018-04-21 – 2018-04-22 (×3): 20 mg via ORAL

## 2018-04-21 MED ORDER — SODIUM CHLORIDE 0.9 % IV SOLP
1000 mL | Freq: Once | INTRAVENOUS | 0 refills | Status: CP
Start: 2018-04-21 — End: ?
  Administered 2018-04-21: 10:00:00 1000 mL via INTRAVENOUS

## 2018-04-21 MED ORDER — CYCLOBENZAPRINE 10 MG PO TAB
10 mg | Freq: Three times a day (TID) | ORAL | 0 refills | Status: DC | PRN
Start: 2018-04-21 — End: 2018-04-25
  Administered 2018-04-21 – 2018-04-23 (×2): 10 mg via ORAL

## 2018-04-21 MED ORDER — OXYCODONE 5 MG PO TAB
10-20 mg | Freq: Four times a day (QID) | ORAL | 0 refills | Status: DC | PRN
Start: 2018-04-21 — End: 2018-04-25
  Administered 2018-04-22 – 2018-04-24 (×9): 20 mg via ORAL
  Administered 2018-04-25: 18:00:00 5 mg via ORAL
  Administered 2018-04-25: 14:00:00 10 mg via ORAL
  Administered 2018-04-25: 03:00:00 20 mg via ORAL
  Administered 2018-04-25: 18:00:00 5 mg via ORAL

## 2018-04-21 NOTE — Care Coordination-Inpatient
For any questions prior to 8am please call Team Med Private Night 7, pager [phone]. After 8am contact Team  Med Private L, pager [phone]

## 2018-04-21 NOTE — Case Management (ED)
monitor for dc needs.    Patient Address/Phone  [address]  [place] [address]  [phone] (home)     Emergency Contact  Extended Emergency Contact Information  Primary Emergency Contact: Mccain,Thomas  Home Phone: [phone]  Mobile Phone: [phone]  Relation: Brother  Interpreter needed? No    Forensic scientist: No, patient does not have a healthcare directive  Would patient like to fill out a (a new) Healthcare Directive?: No, patient declined  Psych Advance Directive (Psych unit only): No, patient does not have a Social research officer, government  Does the patient need discharge transport arranged?: No  Transportation Name, Phone and Availability #1: brother Elijah Birk  Does the patient use Medicaid Transportation?: No    Expected Discharge Date  Expected Discharge Date: 04/23/18  Expected Discharge Time: 1200  Discharge Planning Comments: home with intermittent family assistance    Living Situation Prior to Admission  ? Living Arrangements  Type of Residence: Home, independent  Living Arrangements: Alone  Financial risk analyst / Tub: Tub/Shower Unit  How many levels in the residence?: 1(high rise apartment building with elevator. Apartment is handicap accessible)  Can patient live on one level if needed?: Yes  Does residence have entry and/or side stairs?: No(elevator present)  Assistance needed prior to admit or anticipated on discharge: No  Who provides assistance or could if needed?: brother Maisie Fus  Are they in good health?: Yes  Can support system provide 24/7 care if needed?: Yes  ? Level of Function   Prior level of function: Independent  ? Cognitive Abilities   Cognitive Abilities: Alert and Oriented, Participates in decision making, Recognizes impact of health condition on lifestyle, Engages in problem solving and planning, Understands nature of health condition    Financial Resources  ? Coverage  Primary Insurance: Medicaid  Additional Coverage: RX

## 2018-04-22 ENCOUNTER — Encounter: Admit: 2018-04-22 | Discharge: 2018-04-22

## 2018-04-22 ENCOUNTER — Inpatient Hospital Stay: Admit: 2018-04-22 | Discharge: 2018-04-22

## 2018-04-22 DIAGNOSIS — R042 Hemoptysis: Principal | ICD-10-CM

## 2018-04-22 LAB — BASIC METABOLIC PANEL
Lab: 1.5 mg/dL — ABNORMAL HIGH (ref 0.4–1.24)
Lab: 141 MMOL/L (ref 137–147)
Lab: 23 mg/dL (ref 7–25)
Lab: 3.9 MMOL/L (ref 3.5–5.1)
Lab: 44 mL/min — ABNORMAL LOW (ref >60)
Lab: 54 mL/min — ABNORMAL LOW (ref >60)
Lab: 8.8 mg/dL (ref 8.5–10.6)
Lab: 142 MMOL/L (ref 137–147)
Lab: 22 mg/dL (ref 7–25)
Lab: 23 MMOL/L (ref 21–30)
Lab: 3.5 MMOL/L (ref 3.5–5.1)
Lab: 7.7 mg/dL — ABNORMAL LOW (ref 8.5–10.6)
Lab: 76 mg/dL (ref 70–100)
Lab: 9 (ref 3–12)
Lab: >60 mL/min (ref >60)
Lab: 7 (ref 3–12)

## 2018-04-22 LAB — COMPREHENSIVE METABOLIC PANEL
Lab: 109 MMOL/L — ABNORMAL HIGH (ref >60)
Lab: 142 MMOL/L — ABNORMAL LOW (ref 137–147)
Lab: 3.5 g/dL (ref 3.5–5.0)
Lab: 4.7 MMOL/L — ABNORMAL HIGH (ref >60)
Lab: 9 g/dL — ABNORMAL LOW (ref >60)
Lab: 91 mg/dL (ref 70–100)
Lab: >60 mL/min — ABNORMAL HIGH (ref >60)

## 2018-04-22 LAB — HEMOGLOBIN
Lab: 10 g/dL — ABNORMAL LOW (ref 13.5–16.5)
Lab: 10 g/dL — ABNORMAL LOW (ref 13.5–16.5)
Lab: 9.5 g/dL — ABNORMAL LOW (ref 13.5–16.5)

## 2018-04-22 LAB — CBC AND DIFF: Lab: 53 % (ref 41–77)

## 2018-04-22 NOTE — Progress Notes
Patient arrived to room # [phone]) via wheelchair accompanied by transport. Patient transferred to the bed without assistance. Bedside safety checks completed. Initial patient assessment completed. Refer to flowsheet for details.    Admission skin assessment completed with: Marchelle Folks, RN    Pressure injury present on arrival?: No    1. Head/Face/Neck: No  2. Trunk/Back: No  3. Upper Extremities: No  4. Lower Extremities: No  5. Pelvic/Coccyx: No  6. Assessed for device associated injury? Yes  7. Malnutrition Screening Tool (Nursing Nutrition Assessment) Completed? Yes    See Doc Flowsheet for additional wound details.     INTERVENTIONS:

## 2018-04-22 NOTE — Consults
large-volume bleeding from an unknown etiology.  For this he consistently presents to [facility] and has been scoped by GI and pulmonology to try to find an etiology for this but so far work-up has been negative.  On most recent consultation, pulmonology felt that etiology may be related to the nasal cavity/nasopharynx given the patient's smoking history suggesting a possible mass.  ENT is therefore been consulted for further evaluation.    The patient reports that with these episodes of bleeding he occasionally has blood that comes out of his nose.  He does not report a long history of nasal bleeding.  He did report that with this most recent episodes of large-volume bleeding he did have an episode of epistaxis the day before.  This episode was characterized by blood dripping down the anterior nasal passageway, and posteriorly causing him to swallow blood.  He does not report this history to be true for the prior episodes of bleeding.  He reports this nosebleed resolved spontaneously in a few moments.  The day of the large volume bleed, he does not feel like he had a nosebleed to proceed that.    Medical History:   Diagnosis Date   ??? A-fib (HCC)    ??? Chest pain     with activity   ??? COPD (chronic obstructive pulmonary disease) (HCC)    ??? Hepatitis     C, treated with pills    ??? History of GI bleed 12/01/2017    At [facility] in June, [facility].  Upper GI endoscopy did not identify a cause   ??? Hypertension      Surgical History:   Procedure Laterality Date   ??? SPINE SURGERY  2004   ??? HX TURP  2013   ??? HERNIA REPAIR  2014    Ventral Hernia   ??? ESOPHAGOGASTRODUODENOSCOPY ENDOSCOPIC ULTRASOUND N/A 04/01/2017    Performed by Vertell Novak, MD at ENDO/GI   ??? BRONCHOSCOPY DIAGNOSTIC WITH/ WITHOUT CELL WASHING - FLEXIBLE N/A 09/12/2017    Performed by Cloretta Ned, MD at [facility]   ??? BRONCHOSCOPY WITH BRONCHIAL ALVEOLAR LAVAGE - FLEXIBLE N/A 09/12/2017    Performed by Cloretta Ned, MD at [facility] ??? PERCUTANEOUS CLOSURE LEFT ATRIAL APPENDAGE WITH ENDOCARDIAL IMPLANT, TRANSEPTAL CATHETERIZATION, FOLEY, CONTRAST ANTICIPATED N/A 12/26/2017    Performed by Deniece Ree, MD at [facility]   ??? TRANSESOPHAGEAL ECHOCARDIOGRAM DURING INTERVENTION N/A 12/26/2017    Performed by Vena Austria, MD at [facility]   ??? ESOPHAGOGASTRODUODENOSCOPY WITH SPECIMEN COLLECTION BY BRUSHING/ WASHING N/A 02/21/2018    Performed by Samuel Jester, MD at ENDO/GI   ??? FRACTURE SURGERY  1970s    arm   ??? HX CHOLECYSTECTOMY  1970s   ??? INGUINAL HERNIA REPAIR Bilateral 1950s     Social History     Socioeconomic History   ??? Marital status: Divorced     Spouse name: Not on file   ??? Number of children: Not on file   ??? Years of education: Not on file   ??? Highest education level: Not on file   Occupational History   ??? Not on file   Tobacco Use   ??? Smoking status: Current Every Day Smoker     Packs/day: 0.50     Years: 51.00     Pack years: 25.50     Types: Cigarettes   ??? Smokeless tobacco: Never Used   Substance and Sexual Activity   ??? Alcohol use: Yes     Alcohol/week: 2.0  standard drinks     Types: 2 Cans of beer per week     Frequency: Never   ??? Drug use: Not Currently     Types: Marijuana, Methamphetamines, Cocaine, Heroin     Comment: stopped street drugs in 2002    ??? Sexual activity: Not on file   Other Topics Concern   ??? Not on file   Social History Narrative   ??? Not on file     Family History   Problem Relation Age of Onset   ??? Arthritis Mother    ??? Hearing Loss Mother    ??? Alcohol abuse Father    ??? COPD Sister    ??? Alcohol abuse Brother      Allergies:  Penicillins    Scheduled Meds:aspirin chewable tablet 81 mg, 81 mg, Oral, QDAY  diltiazem CD (cardIZEM CD) capsule 240 mg, 240 mg, Oral, QDAY w/breakfast  duloxetine DR (CYMBALTA) capsule 60 mg, 60 mg, Oral, QDAY w/breakfast  lipase-protease-amylase (CREON 24000) capsule 3 capsule, 3 capsule, Oral, TID w/ meals  metoprolol tartrate (LOPRESSOR) tablet 50 mg, 50 mg, Oral, BID obstructing greater than 90% of the nasal cavity.  Oral cavity: normal dentition with normal occlusion, no gingival inflammation, no lip or mucosal lesions, no palate mobility  Oropharynx: tongue normal, posterior pharynx without erythema or exudate, no old or active bleeding  Neck: supple, no masses, trachea midline, No cervical adenopathy, thyromegaly, or parotid masses  Respiratory: normal respiratory effort, no audible breath sounds  Cardiac: Regular rate, without peripheral edema  Peripheral Vascular: Warm extremities  Musculoskeletal: Good muscular tone. Moving all extremities.    Procedure: Flexible fiberoptic nasopharyngoscopy/laryngoscopy     Findings: After informed discussion of the risks, benefits, and alternatives after indications as noted above, a flexible fiberoptic laryngoscopic exam was performed. Nasal cavities were topically anesthetized and decongested with 4% lidocaine and Afrin solutions after which a flexible scope was easily advanced without difficulty into the left and right nasal cavities as well as into the nasopharynx. Nasal cavities were intact without gross mass or lesion. Nasopharynx, similarly, revealed no mass lesion or granularity. There was no ulceration. There was no gross asymmetry. Fossa of Rosenmueller was intact bilaterally. The eustachian tube orifices were intact bilaterally and patent. Posterior and lateral nasal pharyngeal walls were intact and symmetric without ulceration, mass, or granularity. Scope was now advanced distally. Visible oropharynx including lateral walls and tongue base was clear without lesion. Larynx and hypopharynx were examined. Larynx was intact with normal true vocal cord mobility bilaterally. TVC were symmetric. No discrete masses or lesions in any location. Hypopharynx was clear without asymmetry.  Airway was patent. The flexible fiberoptic  scope was then withdrawn and removed. The patient tolerated the procedure well without complications. Lab/Radiology/Other Diagnostic Tests:  24-hour labs:    Results for orders placed or performed during the hospital encounter of 04/20/18 (from the past 24 hour(s))   CBC AND DIFF    Collection Time: 04/20/18 10:15 PM   Result Value Ref Range    White Blood Cells 9.5 4.5 - 11.0 K/UL    RBC 3.43 (L) 4.4 - 5.5 M/UL    Hemoglobin 11.3 (L) 13.5 - 16.5 GM/DL    Hematocrit 96.2 (L) 40 - 50 %    MCV 96.6 80 - 100 FL    MCH 33.1 26 - 34 PG    MCHC 34.3 32.0 - 36.0 G/DL    RDW 95.2 (H) 11 - 15 %    Platelet Count 181  150 - 400 K/UL    MPV 8.2 7 - 11 FL    Neutrophils 62 41 - 77 %    Lymphocytes 28 24 - 44 %    Monocytes 7 4 - 12 %    Eosinophils 2 0 - 5 %    Basophils 1 0 - 2 %    Absolute Neutrophil Count 6.00 1.8 - 7.0 K/UL    Absolute Lymph Count 2.70 1.0 - 4.8 K/UL    Absolute Monocyte Count 0.70 0 - 0.80 K/UL    Absolute Eosinophil Count 0.20 0 - 0.45 K/UL    Absolute Basophil Count 0.10 0 - 0.20 K/UL   COMPREHENSIVE METABOLIC PANEL    Collection Time: 04/20/18 10:15 PM   Result Value Ref Range    Sodium 141 137 - 147 MMOL/L    Potassium 3.5 3.5 - 5.1 MMOL/L    Chloride 106 98 - 110 MMOL/L    Glucose 89 70 - 100 MG/DL    Blood Urea Nitrogen 14 7 - 25 MG/DL    Creatinine 4.09 0.4 - 1.24 MG/DL    Calcium 9.2 8.5 - 81.1 MG/DL    Total Protein 6.4 6.0 - 8.0 G/DL    Total Bilirubin 0.4 0.3 - 1.2 MG/DL    Albumin 3.6 3.5 - 5.0 G/DL    Alk Phosphatase 99 25 - 110 U/L    AST (SGOT) 18 7 - 40 U/L    CO2 26 21 - 30 MMOL/L    ALT (SGPT) 9 7 - 56 U/L    Anion Gap 9 3 - 12    eGFR Non African American >60 >60 mL/min    eGFR African American >60 >60 mL/min   PTT (APTT)    Collection Time: 04/20/18 10:15 PM   Result Value Ref Range    APTT 29.4 24.0 - 36.5 SEC   PROTIME INR (PT)    Collection Time: 04/20/18 10:15 PM   Result Value Ref Range    INR 1.3 (H) 0.8 - 1.2   TYPE & CROSSMATCH    Collection Time: 04/20/18 10:15 PM   Result Value Ref Range    Units Ordered 0     Crossmatch Expires 04/23/2018     Record Check FOUND ??? 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 Bronchiectasis without complication (HCC) 12/14/2017   ??? COPD (chronic obstructive pulmonary disease) (HCC) 12/14/2017   ??? History of GI bleed 12/01/2017   ??? Hemoptysis 09/08/2017   ??? Pancreatic abnormality 09/08/2017   ??? 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 Underweight 09/08/2017   ??? Essential hypertension 02/24/2015   ??? 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 Chronic atrial fibrillation 02/24/2015   ??? Hx of spinal surgery 02/24/2015

## 2018-04-23 ENCOUNTER — Encounter: Admit: 2018-04-23 | Discharge: 2018-04-23

## 2018-04-23 ENCOUNTER — Inpatient Hospital Stay: Admit: 2018-04-23 | Discharge: 2018-04-23

## 2018-04-23 DIAGNOSIS — R9389 Abnormal findings on diagnostic imaging of other specified body structures: Principal | ICD-10-CM

## 2018-04-23 DIAGNOSIS — R042 Hemoptysis: Principal | ICD-10-CM

## 2018-04-23 LAB — URINALYSIS DIPSTICK
Lab: 5 (ref 5.0–8.0)
Lab: NEGATIVE
Lab: NEGATIVE K/UL (ref 4.5–11.0)
Lab: NEGATIVE
Lab: NEGATIVE
Lab: NEGATIVE
Lab: NEGATIVE
Lab: NEGATIVE
Lab: NEGATIVE
Lab: NEGATIVE
Lab: NEGATIVE

## 2018-04-23 LAB — C REACTIVE PROTEIN (CRP): Lab: 1.3 mg/dL — ABNORMAL HIGH (ref <1.0)

## 2018-04-23 LAB — HEPATITIS B SURFACE AB

## 2018-04-23 LAB — HEMOGLOBIN
Lab: 10 g/dL — ABNORMAL LOW (ref 13.5–16.5)
Lab: 11 g/dL — ABNORMAL LOW (ref 13.5–16.5)
Lab: 8.7 g/dL — ABNORMAL LOW (ref 13.5–16.5)

## 2018-04-23 LAB — BASIC METABOLIC PANEL
Lab: 1.6 mg/dL — ABNORMAL HIGH (ref 0.4–1.24)
Lab: 12 (ref 3–12)
Lab: 135 MMOL/L — ABNORMAL LOW (ref 137–147)
Lab: 18 MMOL/L — ABNORMAL LOW (ref 21–30)
Lab: 26 mg/dL — ABNORMAL HIGH (ref 7–25)
Lab: 4.6 MMOL/L — AB (ref 3.5–5.1)
Lab: 51 mL/min — ABNORMAL LOW (ref >60)
Lab: 70 mg/dL (ref 70–100)
Lab: 8 mg/dL — ABNORMAL LOW (ref 8.5–10.6)

## 2018-04-23 LAB — CHLORIDE-URINE RANDOM: Lab: 22 MMOL/L

## 2018-04-23 LAB — CBC AND DIFF: Lab: 97 FL — ABNORMAL HIGH (ref 80–100)

## 2018-04-23 LAB — SODIUM-URINE RANDOM
Lab: 34 MMOL/L
Lab: 31 MMOL/L (ref 0–5)

## 2018-04-23 LAB — URINALYSIS, MICROSCOPIC

## 2018-04-23 LAB — PROTEIN/CR RATIO,UR RAN
Lab: 37 mg/dL
Lab: 0.3

## 2018-04-23 LAB — TROPONIN-I: Lab: 0 ng/mL (ref 0.0–0.05)

## 2018-04-23 LAB — SED RATE: Lab: 17 mm/h (ref 0–20)

## 2018-04-23 LAB — COMPREHENSIVE METABOLIC PANEL
Lab: 105 MMOL/L — ABNORMAL HIGH (ref 98–110)
Lab: 2.2 mg/dL — ABNORMAL HIGH (ref 0.4–1.24)
Lab: 91 mg/dL — ABNORMAL HIGH (ref 70–100)

## 2018-04-23 LAB — HEPATITIS A IGM: Lab: NEGATIVE

## 2018-04-23 LAB — HEPATITIS C ANTIBODY W REFLEX HCV PCR QUANT

## 2018-04-23 LAB — HEPATITIS B CORE AB TOT (IGG+IGM): Lab: POSITIVE

## 2018-04-23 LAB — TOTAL PROTEIN-URINE RANDOM: Lab: 39 mg/dL — AB

## 2018-04-23 LAB — HEPATITIS B SURFACE AG: Lab: NEGATIVE

## 2018-04-23 MED ORDER — LACTATED RINGERS IV SOLP
1000 mL | INTRAVENOUS | 0 refills | Status: CP
Start: 2018-04-23 — End: ?
  Administered 2018-04-23: 17:00:00 1000 mL via INTRAVENOUS

## 2018-04-23 NOTE — Consults
Sig: Take one tablet by mouth twice daily. ** INVESTIGATIONAL **  Patients own Medication; please send home with patient upon discharge.  Protocol DEA-LAA; Dr. Betti Cruz   albuterol (PROAIR HFA, VENTOLIN HFA, OR PROVENTIL HFA) 90 mcg/actuation inhaler  Self Yes Yes   Sig: Inhale 1-2 puffs by mouth into the lungs every 6 hours as needed for Wheezing or Shortness of Breath. Shake well before use.   aspirin 81 mg chewable tablet  Self No Yes   Sig: Chew one tablet by mouth daily. Take with food.   cholecalciferol (VITAMIN D3) 2,000 unit tablet  Self Yes Yes   Sig: Take 2,000 Units by mouth daily.   cyclobenzaprine (FLEXERIL) 10 mg tablet  Self Yes Yes   Sig: Take 10 mg by mouth three times daily as needed for Muscle Cramps.   diltiazem CD (CARDIZEM CD) 240 mg capsule  Self No Yes   Sig: Take one capsule by mouth daily.   Patient taking differently: Take 240 mg by mouth daily with breakfast.   duloxetine DR (CYMBALTA) 60 mg capsule  Self Yes Yes   Sig: Take 60 mg by mouth daily with breakfast.   gabapentin (NEURONTIN) 300 mg capsule  Self Yes Yes   Sig: Take 300 mg by mouth every 8 hours as needed.   lisinopril (PRINIVIL; ZESTRIL) 10 mg tablet  Self Yes Yes   Sig: Take 10 mg by mouth daily with breakfast.   metoprolol tartrate (LOPRESSOR) 100 mg tablet  Self Yes Yes   Sig: Take 100 mg by mouth twice daily.   omeprazole DR(+) (PRILOSEC) 20 mg capsule med not active Self No No   Sig: Take one capsule by mouth daily before breakfast.   oxycodone (ROXICODONE) 20 mg tablet  Self Yes Yes   Sig: Take 20 mg by mouth every 6 hours as needed     pancrelipase (CREON 24,000) 24,000-76,000 -120,000 units capsule  Self No Yes   Sig: Take three capsules by mouth three times daily with meals. Take 2 tabs by mouth twice daily with snacks.   Patient taking differently: Take 1 capsule by mouth twice daily with meals.   umeclidinium-vilanterol (ANORO ELLIPTA) 62.5-25 mcg/actuation inhaler  Self No Yes

## 2018-04-23 NOTE — Progress Notes
This RN to pt's room to find room smelling like smoke. RN asked pt if he had been smoking in the room. Pt admitted to smoking a cigarette in the bathroom. Educated pt on hospital policy and safety of not smoking in the hospital. When asking patient to search room to remove cigarettes, pt became agitated and did not allow RN's to search belongings. BRT called. Dr. Simeon Craft notified and aware and coming to bedside to assess patient.

## 2018-04-24 ENCOUNTER — Encounter: Admit: 2018-04-24 | Discharge: 2018-04-24

## 2018-04-24 ENCOUNTER — Inpatient Hospital Stay: Admit: 2018-04-24 | Discharge: 2018-04-24

## 2018-04-24 DIAGNOSIS — Z8719 Personal history of other diseases of the digestive system: ICD-10-CM

## 2018-04-24 DIAGNOSIS — R079 Chest pain, unspecified: ICD-10-CM

## 2018-04-24 DIAGNOSIS — J449 Chronic obstructive pulmonary disease, unspecified: ICD-10-CM

## 2018-04-24 DIAGNOSIS — I10 Essential (primary) hypertension: Principal | ICD-10-CM

## 2018-04-24 DIAGNOSIS — K759 Inflammatory liver disease, unspecified: ICD-10-CM

## 2018-04-24 DIAGNOSIS — I4891 Unspecified atrial fibrillation: ICD-10-CM

## 2018-04-24 LAB — FUNGITELL: Lab: <31

## 2018-04-24 LAB — IRON + BINDING CAPACITY + %SAT+ FERRITIN
Lab: 15 % — ABNORMAL LOW (ref 28–42)
Lab: 434 ug/dL — ABNORMAL HIGH (ref 270–380)
Lab: 66 ug/dL (ref 50–185)
Lab: 68 ng/mL (ref 30–300)

## 2018-04-24 LAB — CBC AND DIFF
Lab: 3 M/UL — ABNORMAL LOW (ref 4.4–5.5)
Lab: 33 pg — ABNORMAL HIGH (ref >60)

## 2018-04-24 LAB — RVP VIRAL PANEL PCR

## 2018-04-24 LAB — MPO/PR3 W REFLEX TO ANCA

## 2018-04-24 LAB — ASPERGILLUS (GALACTOMANNAN) AG: Lab: 0

## 2018-04-24 LAB — GLOMERULAR BASEMENT MEMBRANE ANTIBODIES

## 2018-04-24 LAB — HEMOGLOBIN: Lab: 10 g/dL — ABNORMAL LOW (ref 13.5–16.5)

## 2018-04-24 MED ORDER — PROPOFOL 10 MG/ML IV EMUL (INFUSION)(AM)(OR)
INTRAVENOUS | 0 refills | Status: DC
Start: 2018-04-24 — End: 2018-04-24
  Administered 2018-04-24: 19:00:00 150 ug/kg/min via INTRAVENOUS

## 2018-04-24 MED ORDER — SODIUM CHLORIDE 0.9 % IV SOLP
1000 mL | INTRAVENOUS | 0 refills | Status: DC
Start: 2018-04-24 — End: 2018-04-25
  Administered 2018-04-24: 18:00:00 1000 mL via INTRAVENOUS

## 2018-04-24 MED ORDER — LACTATED RINGERS IV SOLP
1000 mL | Freq: Once | INTRAVENOUS | 0 refills | Status: CP
Start: 2018-04-24 — End: ?
  Administered 2018-04-24: 17:00:00 1000 mL via INTRAVENOUS

## 2018-04-24 MED ORDER — REMIFENTANYL 1000MCG IN NS 20ML (OR)
0 refills | Status: DC
Start: 2018-04-24 — End: 2018-04-24
  Administered 2018-04-24 (×2): .06 ug/kg/min via INTRAVENOUS

## 2018-04-24 MED ORDER — SUCCINYLCHOLINE CHLORIDE 20 MG/ML IJ SOLN
0 refills | Status: DC
Start: 2018-04-24 — End: 2018-04-24
  Administered 2018-04-24: 19:00:00 120 mg via INTRAVENOUS

## 2018-04-24 MED ORDER — PROPOFOL INJ 10 MG/ML IV VIAL
INTRAVENOUS | 0 refills | Status: DC
Start: 2018-04-24 — End: 2018-04-24
  Administered 2018-04-24: 19:00:00 120 mg via INTRAVENOUS

## 2018-04-24 MED ORDER — FENTANYL CITRATE (PF) 50 MCG/ML IJ SOLN
0 refills | Status: DC
Start: 2018-04-24 — End: 2018-04-24
  Administered 2018-04-24: 19:00:00 50 ug via INTRAVENOUS

## 2018-04-24 MED ORDER — LIDOCAINE (PF) 200 MG/10 ML (2 %) IJ SYRG
0 refills | Status: DC
Start: 2018-04-24 — End: 2018-04-24
  Administered 2018-04-24: 19:00:00 60 mg via INTRAVENOUS

## 2018-04-25 ENCOUNTER — Inpatient Hospital Stay: Admit: 2018-04-21 | Discharge: 2018-04-21

## 2018-04-25 ENCOUNTER — Emergency Department: Admit: 2018-04-20 | Discharge: 2018-04-20

## 2018-04-25 ENCOUNTER — Inpatient Hospital Stay: Admit: 2018-04-24 | Discharge: 2018-04-24

## 2018-04-25 DIAGNOSIS — Z9114 Patient's other noncompliance with medication regimen: ICD-10-CM

## 2018-04-25 DIAGNOSIS — R042 Hemoptysis: Principal | ICD-10-CM

## 2018-04-25 DIAGNOSIS — J440 Chronic obstructive pulmonary disease with acute lower respiratory infection: ICD-10-CM

## 2018-04-25 DIAGNOSIS — G8929 Other chronic pain: ICD-10-CM

## 2018-04-25 DIAGNOSIS — F17210 Nicotine dependence, cigarettes, uncomplicated: ICD-10-CM

## 2018-04-25 DIAGNOSIS — Z9049 Acquired absence of other specified parts of digestive tract: ICD-10-CM

## 2018-04-25 DIAGNOSIS — J342 Deviated nasal septum: ICD-10-CM

## 2018-04-25 DIAGNOSIS — I482 Chronic atrial fibrillation, unspecified: ICD-10-CM

## 2018-04-25 DIAGNOSIS — D494 Neoplasm of unspecified behavior of bladder: ICD-10-CM

## 2018-04-25 DIAGNOSIS — N179 Acute kidney failure, unspecified: ICD-10-CM

## 2018-04-25 DIAGNOSIS — I10 Essential (primary) hypertension: ICD-10-CM

## 2018-04-25 DIAGNOSIS — E440 Moderate protein-calorie malnutrition: ICD-10-CM

## 2018-04-25 LAB — COMPREHENSIVE METABOLIC PANEL
Lab: 1 mg/dL (ref 0.3–1.2)
Lab: 1.7 mg/dL — ABNORMAL HIGH (ref 0.4–1.24)
Lab: 108 MMOL/L (ref 98–110)
Lab: 119 mg/dL — ABNORMAL HIGH (ref 70–100)
Lab: 12 U/L (ref 7–56)
Lab: 140 MMOL/L (ref 137–147)
Lab: 22 MMOL/L (ref 21–30)
Lab: 26 U/L (ref 7–40)
Lab: 3.8 g/dL (ref 3.5–5.0)
Lab: 4.8 MMOL/L (ref 3.5–5.1)
Lab: 40 mL/min — ABNORMAL LOW (ref >60)
Lab: 41 mg/dL — ABNORMAL HIGH (ref 7–25)
Lab: 49 mL/min — ABNORMAL LOW (ref >60)
Lab: 6.5 g/dL (ref 6.0–8.0)
Lab: 9 mg/dL (ref 8.5–10.6)
Lab: 95 U/L (ref 25–110)

## 2018-04-25 LAB — HEPATITIS C VIRAL LOAD PCR QUANT

## 2018-04-25 LAB — GRAM STAIN

## 2018-04-25 LAB — CBC AND DIFF
Lab: 10 g/dL — ABNORMAL LOW (ref 13.5–16.5)
Lab: 16 10*3/uL — ABNORMAL HIGH (ref 4.5–11.0)
Lab: 3.2 M/UL — ABNORMAL LOW (ref 4.4–5.5)
Lab: 31 % — ABNORMAL LOW (ref 40–50)
Lab: 33 pg (ref 26–34)
Lab: 97 FL (ref 80–100)

## 2018-04-25 LAB — HEMOGLOBIN: Lab: 11 g/dL — ABNORMAL LOW (ref 13.5–16.5)

## 2018-04-25 LAB — HERPES SIMPLEX PCR - NON-BLOOD

## 2018-04-25 MED ORDER — ACETAMINOPHEN 325 MG PO TAB
650 mg | ORAL_TABLET | ORAL | 0 refills | Status: SS | PRN
Start: 2018-04-25 — End: 2018-09-05

## 2018-04-25 NOTE — Care Plan
Problem: Falls, High Risk of  Goal: Absence of falls-Adult Patient  Outcome: Goal Achieved  Note:   High fall bundle in place. Patient and family educated on high fall risk. Bed in lowest locked position. Bed alarm in place when appropriate. Call light within reach.        Problem: Pain  Goal: Management of pain  Outcome: Goal Achieved  Note:   Pain assessed Q4 and PRN. Patient verbalizes pain on scale of 0-10. PRN pain medications used as needed. See eMAR for details.     Goal: Knowledge of pain management  Outcome: Goal Achieved  Note:   Patient educated on available pain management options available.     Goal: Progress Toward Pain Management Goals  Outcome: Goal Achieved  Note:   Patient with persistent back pain. PRN oxycodone and tylenol administered.     Problem: Nutrition Deficit  Goal: Adequate nutritional intake  Outcome: Goal Achieved  Note:   Patient tolerating cardiac diet with thin liquids.   Goal: Body weight within specified parameters  Outcome: Goal Achieved     Problem: Discharge Planning  Goal: Participation in plan of care  Outcome: Goal Achieved  Note:   Patient actively participates in plan of care.     Goal: Knowledge regarding plan of care  Outcome: Goal Achieved  Note:   Patient and family educated on plan of care. Patient encouraged to ask questions regarding plan of care. This RN will continue to update patient to any changes in plan of care.     Goal: Prepared for discharge  Outcome: Goal Achieved  Note:   Patient discharging home with family assistance. Patient educated multiple times regarding importance of follow up with PCP within 1 week for labs and scheduled follow up in April. Patient verbalizes understanding and is in agreement with discharge plan.       Problem: Respiratory Impairment (Non-Ventilated Patient)  Goal: Effective gas exchange  Outcome: Goal Achieved  Note:   Patient's breathing non labored and symmetrical.   Goal: Effective breathing pattern  Outcome: Goal Achieved Note:   Patient's breathing non labored.   Goal: Patent airway  Outcome: Goal Achieved  Note:   Patient's airway patent.      Problem: Fluid Volume, Imbalanced  Goal: Absence of dehydration  Outcome: Goal Achieved  Note:   No signs or symptoms of dehydration noted.

## 2018-04-25 NOTE — Progress Notes
Patient threatening to leave AMA. Educating patient on importance of staying at the hospital until released by the doctor. Patient leaving unit to smoke periodically. Will hold off tele pack until patient has calmed down and agreed to stay.

## 2018-04-25 NOTE — Progress Notes
RT Adult Assessment Note    NAME:Zayin Crecencio Kwiatek             MRN: [id number]             DOB:12-15-1950          AGE: 68 y.o.  ADMISSION DATE: 04/20/2018             DAYS ADMITTED: LOS: 4 days    RT Treatment Plan:  Protocol Plan: Medications  Albuterol: MDI PRN    Protocol Plan: Procedures  Comment: Anoro Q day    Additional Comments:  Impressions of the patient: Pt alert and oriented. RA. No distress.  Intervention(s)/outcome(s): none  Patient education that was completed: none  Recommendations to the care team: Anoro Q day, Alb PRN    Vital Signs:  Pulse: 89  RR: 18 PER MINUTE  SpO2: 98 %  O2 Device:    Liter Flow:    O2%: 21 %  Breath Sounds:  Clear  Respiratory Effort: Non-Labored

## 2018-04-26 ENCOUNTER — Encounter: Admit: 2018-04-26 | Discharge: 2018-04-26

## 2018-04-26 DIAGNOSIS — I4891 Unspecified atrial fibrillation: ICD-10-CM

## 2018-04-26 DIAGNOSIS — Z8719 Personal history of other diseases of the digestive system: ICD-10-CM

## 2018-04-26 DIAGNOSIS — J449 Chronic obstructive pulmonary disease, unspecified: ICD-10-CM

## 2018-04-26 DIAGNOSIS — R079 Chest pain, unspecified: ICD-10-CM

## 2018-04-26 DIAGNOSIS — K759 Inflammatory liver disease, unspecified: ICD-10-CM

## 2018-04-26 DIAGNOSIS — I10 Essential (primary) hypertension: Principal | ICD-10-CM

## 2018-04-26 LAB — CELL COUNT W/DIFF-FLUIDS
Lab: 148 /uL
Lab: 3 %
Lab: 320 /uL
Lab: 8 %
Lab: 89 %

## 2018-04-26 LAB — ASPERGILLUS GALACTOMANN: Lab: 0.1 mL/min

## 2018-04-26 LAB — PJP JIROVECI QUANT PCR

## 2018-04-26 LAB — CMV QUANT PCR-FLUID

## 2018-04-27 ENCOUNTER — Encounter: Admit: 2018-04-27 | Discharge: 2018-04-27

## 2018-04-27 LAB — CULTURE-RESP,LOWER W/SENSITIVITY: Lab: LOW MMOL/L (ref 21–30)

## 2018-04-27 MED ORDER — DOXYCYCLINE HYCLATE 100 MG PO TAB
100 mg | ORAL_TABLET | Freq: Two times a day (BID) | ORAL | 0 refills | 8.00000 days | Status: AC
Start: 2018-04-27 — End: ?

## 2018-04-27 NOTE — Telephone Encounter
Patient's BAL growing Haemophilus influenzae and strep pneumo. Will call in 14 days of doxycycline based on culture results and patient allergy of penicillins with the long duration in setting of his bronchiectasis. I attempted to call patient back to inform him of this, but there was no answer and no identifying information on voicemail. Ronni, if he calls back, please inform him of above.

## 2018-05-03 ENCOUNTER — Encounter: Admit: 2018-05-03 | Discharge: 2018-05-03

## 2018-05-09 ENCOUNTER — Encounter: Admit: 2018-05-09 | Discharge: 2018-05-09

## 2018-05-09 ENCOUNTER — Encounter: Admit: 2018-05-09 | Discharge: 2018-05-09 | Payer: MEDICAID

## 2018-05-09 DIAGNOSIS — J449 Chronic obstructive pulmonary disease, unspecified: ICD-10-CM

## 2018-05-09 DIAGNOSIS — Z8719 Personal history of other diseases of the digestive system: ICD-10-CM

## 2018-05-09 DIAGNOSIS — K759 Inflammatory liver disease, unspecified: ICD-10-CM

## 2018-05-09 DIAGNOSIS — I10 Essential (primary) hypertension: Principal | ICD-10-CM

## 2018-05-09 DIAGNOSIS — N3289 Other specified disorders of bladder: Principal | ICD-10-CM

## 2018-05-09 DIAGNOSIS — I4891 Unspecified atrial fibrillation: ICD-10-CM

## 2018-05-09 DIAGNOSIS — R079 Chest pain, unspecified: ICD-10-CM

## 2018-05-09 DIAGNOSIS — C679 Malignant neoplasm of bladder, unspecified: Principal | ICD-10-CM

## 2018-05-09 MED ORDER — CEFAZOLIN INJ 1GM IVP
2 g | Freq: Once | INTRAVENOUS | 0 refills | Status: CN
Start: 2018-05-09 — End: ?

## 2018-05-09 NOTE — Progress Notes
Neurological:      Mental Status: He is alert and oriented to person, place, and time.   Psychiatric:         Behavior: Behavior normal.         Thought Content: Thought content normal.         Judgment: Judgment normal.               Assessment and Plan:  In summary we have a 68 year old gentleman with cystoscopically confirmed bladder tumor.  We had a long discussion regarding this finding and bladder cancer.  Transurethral resection was discussed in detail.  Risk benefits and complications were reviewed.  We will schedule in the immediate future.  I thank you for permitting me to partake in his care will keep you informed of my findings and his progress.

## 2018-05-11 DIAGNOSIS — C679 Malignant neoplasm of bladder, unspecified: Principal | ICD-10-CM

## 2018-05-22 ENCOUNTER — Encounter: Admit: 2018-05-22 | Discharge: 2018-05-22

## 2018-05-22 ENCOUNTER — Ambulatory Visit: Admit: 2018-05-22 | Discharge: 2018-05-22

## 2018-05-22 ENCOUNTER — Ambulatory Visit: Admit: 2018-05-22 | Discharge: 2018-05-22 | Payer: MEDICAID

## 2018-05-22 DIAGNOSIS — C679 Malignant neoplasm of bladder, unspecified: Principal | ICD-10-CM

## 2018-05-22 DIAGNOSIS — I4891 Unspecified atrial fibrillation: ICD-10-CM

## 2018-05-22 DIAGNOSIS — R079 Chest pain, unspecified: ICD-10-CM

## 2018-05-22 DIAGNOSIS — I10 Essential (primary) hypertension: Principal | ICD-10-CM

## 2018-05-22 DIAGNOSIS — Z880 Allergy status to penicillin: ICD-10-CM

## 2018-05-22 DIAGNOSIS — Z8719 Personal history of other diseases of the digestive system: ICD-10-CM

## 2018-05-22 DIAGNOSIS — Z7982 Long term (current) use of aspirin: ICD-10-CM

## 2018-05-22 DIAGNOSIS — J449 Chronic obstructive pulmonary disease, unspecified: ICD-10-CM

## 2018-05-22 DIAGNOSIS — K759 Inflammatory liver disease, unspecified: ICD-10-CM

## 2018-05-22 MED ORDER — SUCCINYLCHOLINE CHLORIDE 20 MG/ML IJ SOLN
INTRAVENOUS | 0 refills | Status: DC
Start: 2018-05-22 — End: 2018-05-22
  Administered 2018-05-22: 21:00:00 140 mg via INTRAVENOUS

## 2018-05-22 MED ORDER — OXYCODONE 5 MG PO TAB
5-10 mg | Freq: Once | ORAL | 0 refills | Status: CP | PRN
Start: 2018-05-22 — End: ?
  Administered 2018-05-22: 22:00:00 10 mg via ORAL

## 2018-05-22 MED ORDER — FENTANYL CITRATE (PF) 50 MCG/ML IJ SOLN
50 ug | INTRAVENOUS | 0 refills | Status: DC | PRN
Start: 2018-05-22 — End: 2018-05-23

## 2018-05-22 MED ORDER — LABETALOL 5 MG/ML IV SYRG
10 mg | Freq: Once | INTRAVENOUS | 0 refills | Status: CP
Start: 2018-05-22 — End: ?
  Administered 2018-05-22: 23:00:00 10 mg via INTRAVENOUS

## 2018-05-22 MED ORDER — LIDOCAINE HCL 2 % MM JELP
0 refills | Status: DC
Start: 2018-05-22 — End: 2018-05-22
  Administered 2018-05-22: 21:00:00 20 mL via TOPICAL

## 2018-05-22 MED ORDER — DEXTRAN 70-HYPROMELLOSE (PF) 0.1-0.3 % OP DPET
0 refills | Status: DC
Start: 2018-05-22 — End: 2018-05-22
  Administered 2018-05-22: 21:00:00 2 [drp] via OPHTHALMIC

## 2018-05-22 MED ORDER — DEXAMETHASONE SODIUM PHOSPHATE 4 MG/ML IJ SOLN
INTRAVENOUS | 0 refills | Status: DC
Start: 2018-05-22 — End: 2018-05-22
  Administered 2018-05-22: 21:00:00 4 mg via INTRAVENOUS

## 2018-05-22 MED ORDER — CEFAZOLIN INJ 1GM IVP
2 g | Freq: Once | INTRAVENOUS | 0 refills | Status: CP
Start: 2018-05-22 — End: ?
  Administered 2018-05-22: 21:00:00 2 g via INTRAVENOUS

## 2018-05-22 MED ORDER — OXYCODONE-ACETAMINOPHEN 5-325 MG PO TAB
1 | ORAL_TABLET | ORAL | 0 refills | Status: SS | PRN
Start: 2018-05-22 — End: 2018-09-05

## 2018-05-22 MED ORDER — STERILE WATER IRRIGATION BAG
0 refills | Status: DC
Start: 2018-05-22 — End: 2018-05-22
  Administered 2018-05-22: 21:00:00 3000 mL

## 2018-05-22 MED ORDER — PHENAZOPYRIDINE 100 MG PO TAB
100 mg | ORAL_TABLET | Freq: Three times a day (TID) | ORAL | 0 refills | Status: SS | PRN
Start: 2018-05-22 — End: 2018-09-05

## 2018-05-22 MED ORDER — OXYBUTYNIN CHLORIDE 5 MG PO TAB
5 mg | ORAL_TABLET | Freq: Three times a day (TID) | ORAL | 3 refills | 12.00000 days | Status: AC | PRN
Start: 2018-05-22 — End: ?
  Filled 2018-05-22 (×2): qty 30, 10d supply, fill #1

## 2018-05-22 MED ORDER — ALBUTEROL SULFATE 90 MCG/ACTUATION IN HFAA
0 refills | Status: DC
Start: 2018-05-22 — End: 2018-05-22
  Administered 2018-05-22: 21:00:00 4 via RESPIRATORY_TRACT

## 2018-05-22 MED ORDER — SODIUM CHLORIDE 0.9 % IV SOLP
1000 mL | INTRAVENOUS | 0 refills | Status: DC
Start: 2018-05-22 — End: 2018-05-23
  Administered 2018-05-22: 21:00:00 1000.000 mL via INTRAVENOUS

## 2018-05-22 MED ORDER — LIDOCAINE (PF) 200 MG/10 ML (2 %) IJ SYRG
0 refills | Status: DC
Start: 2018-05-22 — End: 2018-05-22
  Administered 2018-05-22: 21:00:00 80 mg via INTRAVENOUS
  Administered 2018-05-22 (×2): 60 mg via INTRAVENOUS

## 2018-05-22 MED ORDER — CIPROFLOXACIN HCL 500 MG PO TAB
500 mg | ORAL_TABLET | Freq: Two times a day (BID) | ORAL | 0 refills | 10.00000 days | Status: AC
Start: 2018-05-22 — End: ?
  Filled 2018-05-22 (×2): qty 6, 3d supply, fill #1

## 2018-05-22 MED FILL — OXYCODONE-ACETAMINOPHEN 5-325 MG PO TAB: 5/325 mg | ORAL | 1 days supply | Qty: 5 | Fill #1 | Status: CP

## 2018-05-22 MED FILL — PHENAZOPYRIDINE 100 MG PO TAB: 100 mg | ORAL | 2 days supply | Qty: 6 | Fill #1 | Status: CP

## 2018-05-22 NOTE — Other
Brief Operative Note    Name: Alexis Weeks is a 68 y.o. 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 2 cm papillary tumor on trigone just posteromedial to the left ureteral orifice; resected entirely  2. Very tight fossa navicularis; dilated    Estimated Blood Loss: Minimal    Specimen(s) Removed/Disposition:   ID Type Source Tests Collected by Time Destination   1 : LEFT POSTEROLATERAL TUMOR Tissue Bladder Chips SURGICAL PATHOLOGY          Barbra Sarks, MD 05/22/2018 1610        Complications:  None    Implants: None    Drains: None    Disposition:  PACU - stable    Meta Hatchet, MD  Pager [phone]

## 2018-05-24 ENCOUNTER — Encounter: Admit: 2018-05-24 | Discharge: 2018-05-24

## 2018-05-24 DIAGNOSIS — Z8719 Personal history of other diseases of the digestive system: ICD-10-CM

## 2018-05-24 DIAGNOSIS — R079 Chest pain, unspecified: ICD-10-CM

## 2018-05-24 DIAGNOSIS — I10 Essential (primary) hypertension: Principal | ICD-10-CM

## 2018-05-24 DIAGNOSIS — J449 Chronic obstructive pulmonary disease, unspecified: ICD-10-CM

## 2018-05-24 DIAGNOSIS — I4891 Unspecified atrial fibrillation: ICD-10-CM

## 2018-05-24 DIAGNOSIS — K759 Inflammatory liver disease, unspecified: ICD-10-CM

## 2018-05-25 ENCOUNTER — Encounter: Admit: 2018-05-25 | Discharge: 2018-05-25

## 2018-05-29 LAB — CULTURE-FUNGAL,OTHER

## 2018-06-01 ENCOUNTER — Encounter: Admit: 2018-06-01 | Discharge: 2018-06-01

## 2018-06-12 LAB — CULTURE-TB (AFB)

## 2018-06-21 ENCOUNTER — Encounter: Admit: 2018-06-21 | Discharge: 2018-06-21

## 2018-06-21 ENCOUNTER — Ambulatory Visit: Admit: 2018-06-21 | Discharge: 2018-06-22 | Payer: MEDICAID

## 2018-06-21 DIAGNOSIS — J449 Chronic obstructive pulmonary disease, unspecified: ICD-10-CM

## 2018-06-21 DIAGNOSIS — I10 Essential (primary) hypertension: Principal | ICD-10-CM

## 2018-06-21 DIAGNOSIS — Z8719 Personal history of other diseases of the digestive system: ICD-10-CM

## 2018-06-21 DIAGNOSIS — R079 Chest pain, unspecified: ICD-10-CM

## 2018-06-21 DIAGNOSIS — I4891 Unspecified atrial fibrillation: ICD-10-CM

## 2018-06-21 DIAGNOSIS — K759 Inflammatory liver disease, unspecified: ICD-10-CM

## 2018-06-22 DIAGNOSIS — R918 Other nonspecific abnormal finding of lung field: Principal | ICD-10-CM

## 2018-06-22 DIAGNOSIS — J479 Bronchiectasis, uncomplicated: ICD-10-CM

## 2018-06-26 ENCOUNTER — Encounter: Admit: 2018-06-26 | Discharge: 2018-06-26

## 2018-06-26 DIAGNOSIS — K759 Inflammatory liver disease, unspecified: ICD-10-CM

## 2018-06-26 DIAGNOSIS — R079 Chest pain, unspecified: ICD-10-CM

## 2018-06-26 DIAGNOSIS — Z8719 Personal history of other diseases of the digestive system: ICD-10-CM

## 2018-06-26 DIAGNOSIS — I4891 Unspecified atrial fibrillation: ICD-10-CM

## 2018-06-26 DIAGNOSIS — I10 Essential (primary) hypertension: Principal | ICD-10-CM

## 2018-06-26 DIAGNOSIS — J449 Chronic obstructive pulmonary disease, unspecified: ICD-10-CM

## 2018-06-26 MED ORDER — AZITHROMYCIN 250 MG PO TAB
500 mg | ORAL_TABLET | ORAL | 1 refills | Status: DC
Start: 2018-06-26 — End: 2018-11-29

## 2018-06-29 ENCOUNTER — Encounter: Admit: 2018-06-29 | Discharge: 2018-06-29

## 2018-07-04 ENCOUNTER — Ambulatory Visit: Admit: 2018-07-04 | Discharge: 2018-07-04 | Payer: MEDICAID

## 2018-07-04 ENCOUNTER — Encounter: Admit: 2018-07-04 | Discharge: 2018-07-04

## 2018-07-04 DIAGNOSIS — R918 Other nonspecific abnormal finding of lung field: Principal | ICD-10-CM

## 2018-08-09 ENCOUNTER — Encounter: Admit: 2018-08-09 | Discharge: 2018-08-09

## 2018-08-09 DIAGNOSIS — R918 Other nonspecific abnormal finding of lung field: Principal | ICD-10-CM

## 2018-08-16 ENCOUNTER — Encounter: Admit: 2018-08-16 | Discharge: 2018-08-16

## 2018-08-16 NOTE — Telephone Encounter
Received voicemail from someone named York Cerise calling on pt's behalf.  Roselyn Reef states that pt's hemoptysis had improved over the past weekend, but Monday night (08/14/2018) pt had a "massive episode".    RN attempted to reach pt to discuss episodes mentioned, but pt did not answer.  Left voicemail for pt to call back.

## 2018-08-17 NOTE — Telephone Encounter
Received voicemail from pt returning call regarding hemoptysis.     Returned pt's call.  Pt reports on Monday night, 08/14/2018, pt had an episode of hemoptysis and vomiting blood.  Pt reports he lost more than a quart of blood between coughing and vomiting.  Pt also reports extreme abdominal pain during this episode.  Pt reports minimal hemoptysis on 08/15/2018 and 08/16/2018 with no hemoptysis today, 08/17/2018.  Pt also experienced chest pain and tenderness during hemoptysis/vomiting.    Pt reports having a hernia repair not too long ago, but cannot recall when.  Pt reports still having a lot of pain at the site which has worsened since Monday.  Pt reports site of procedure is tender to touch.  Pt did have a transurethral resection of a large bladder tumor in March with documentation of continued pain post-op which has been treated by pt's PCP, Dr. Rosalio Macadamia (Telephone notes on 06/01/2018).      Pt also reports fatigue.  Pt reports that since the episode of hemoptysis/vomiting on 08/14/2018, pt has felt more fatigued, but says this is from losing so much blood.      Pt denies other new or worsening symptoms including shortness of breath, coughing,  wheezing, chest tightness, dizziness/lightheadedness, fever, chills, night sweats.      Pt reports taking azithromycin 500mg  twice daily because it seems to help the bleeding.  RN advised pt that azithromycin is an antibiotic and should only be taken as prescribed by the physician.  Pt voiced understanding.     RN did inquire as to why pt did not proceed to the ED when coughing and vomiting blood.  Pt states he didn't want to bother anyone because it was late and the phone was put somewhere high so I couldn't have gotten to it anyway.  RN advised pt that in the future, if pt coughs up more than a tablespoon of blood pt is to proceed to the ED immediately for evaluation.    RN also encouraged pt to proceed to the ED multiple times, however pt declined stating he has an appointment with Dr. Nedra Hai at 3:30pm and will discuss with Dr. Nedra Hai first.   RN again encouraged pt that with the symptoms of significant hemoptysis/vomiting blood, severe abdominal pain, and fatigue, pt should be seen in the ED for workup.  Pt again declined.  Pt did state he will call with an update on 08/18/18 or 08/21/2018.    Spoke with Dr. Milford Cage and notified of pt's symptoms and pt will call RN with update of status.

## 2018-08-18 NOTE — Telephone Encounter
Pt left voicemail requesting call back.  Pt reports that Dr. Truman Hayward advised pt to have testing done for lungs and stomach, but unsure what the testing is.  Pt stated that Dr. Truman Hayward told pt that someone from [facility] will contact pt on Monday to coordinate testing.     Pt reports still "spitting up blood" today one time.  Pt states this was not from coughing, but was coming from his stomach.  Pt reports that it was less than a tablespoon, but was bright red blood.     Pt advised that he should be evaluated, but pt again declined.  Pt advised of possible causes of vomiting blood and the severity of delaying treatment.  Pt voiced understanding, but again declined to seek treatment.  Pt stated if he spits up more blood he will go to the ED.      Routing to Dr. Sallyanne Kuster for [facility].

## 2018-08-22 ENCOUNTER — Encounter: Admit: 2018-08-22 | Discharge: 2018-08-22

## 2018-08-22 DIAGNOSIS — J449 Chronic obstructive pulmonary disease, unspecified: Secondary | ICD-10-CM

## 2018-08-22 DIAGNOSIS — N3289 Other specified disorders of bladder: Secondary | ICD-10-CM

## 2018-08-22 DIAGNOSIS — I4891 Unspecified atrial fibrillation: Secondary | ICD-10-CM

## 2018-08-22 DIAGNOSIS — K759 Inflammatory liver disease, unspecified: Secondary | ICD-10-CM

## 2018-08-22 DIAGNOSIS — C679 Malignant neoplasm of bladder, unspecified: Secondary | ICD-10-CM

## 2018-08-22 DIAGNOSIS — Z8719 Personal history of other diseases of the digestive system: Secondary | ICD-10-CM

## 2018-08-22 DIAGNOSIS — R079 Chest pain, unspecified: Secondary | ICD-10-CM

## 2018-08-22 DIAGNOSIS — I10 Essential (primary) hypertension: Secondary | ICD-10-CM

## 2018-08-22 NOTE — Progress Notes
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  He presents today for interval cystoscopy at 3 months    Cysto - ned    A/P  TA low-grade bladder cancer with no evidence of recurrence at 3 months he will follow-up in 6.

## 2018-08-22 NOTE — Telephone Encounter
Calling to see if patient remembered appt with Dr. Lovena Le today.

## 2018-08-22 NOTE — Procedures
Clean and prepped in standard fashion.  2% lidocaine jelly for local anesthesia.  16 French flexible cystoscope introduced with ease.  Urethra appears normal.  Prostate with bilobar hypertrophy.  Bladder entered distended with sterile water.  General survey reveals no evidence of mucosal abnormalities or recurrence.  Cystoscope removed.  He tolerated well.

## 2018-08-28 ENCOUNTER — Encounter: Admit: 2018-08-28 | Discharge: 2018-08-28

## 2018-08-28 ENCOUNTER — Emergency Department: Admit: 2018-08-28 | Discharge: 2018-08-29

## 2018-08-28 ENCOUNTER — Emergency Department: Admit: 2018-08-28 | Discharge: 2018-08-29 | Discharge status: Against Medical Advice

## 2018-08-28 DIAGNOSIS — K859 Acute pancreatitis without necrosis or infection, unspecified: Secondary | ICD-10-CM

## 2018-08-28 LAB — CBC AND DIFF
Lab: 0 % (ref >60)
Lab: 0 % (ref >60)
Lab: 0 10*3/uL (ref 0–0.20)
Lab: 0 10*3/uL (ref 0–0.45)
Lab: 1.1 10*3/uL — ABNORMAL HIGH (ref 0–0.80)
Lab: 11 g/dL — ABNORMAL LOW (ref 13.5–16.5)
Lab: 12 10*3/uL — ABNORMAL HIGH (ref 4.5–11.0)
Lab: 16 % — ABNORMAL LOW (ref 24–44)
Lab: 2 10*3/uL (ref 1.0–4.8)
Lab: 3.4 M/UL — ABNORMAL LOW (ref 4.4–5.5)
Lab: 75 % (ref 41–77)
Lab: 9 % (ref 4–12)
Lab: 9 10*3/uL — ABNORMAL HIGH (ref 1.8–7.0)
Lab: 96 FL (ref 80–100)

## 2018-08-28 LAB — URINALYSIS DIPSTICK
Lab: NEGATIVE
Lab: NEGATIVE
Lab: NEGATIVE
Lab: NEGATIVE
Lab: NEGATIVE

## 2018-08-28 LAB — URINALYSIS, MICROSCOPIC

## 2018-08-28 LAB — COMPREHENSIVE METABOLIC PANEL
Lab: 137 MMOL/L (ref 137–147)
Lab: 3.7 MMOL/L (ref 3.5–5.1)

## 2018-08-28 LAB — LIPASE: Lab: >30 U/L — ABNORMAL HIGH (ref 11–82)

## 2018-08-28 LAB — POC LACTATE: Lab: 1.1 MMOL/L (ref 0.5–2.0)

## 2018-08-28 LAB — POC CREATININE, RAD: Lab: 1 mg/dL (ref 0.4–1.24)

## 2018-08-28 LAB — POC TROPONIN: Lab: 0 ng/mL (ref 0.00–0.05)

## 2018-08-28 MED ORDER — PANTOPRAZOLE 40 MG IV SOLR
80 mg | Freq: Once | INTRAVENOUS | 0 refills | Status: CP
Start: 2018-08-28 — End: ?
  Administered 2018-08-28: 21:00:00 80 mg via INTRAVENOUS

## 2018-08-28 MED ORDER — OXYCODONE 10 MG PO TAB
10 mg | Freq: Once | ORAL | 0 refills | Status: CP
Start: 2018-08-28 — End: ?
  Administered 2018-08-28: 23:00:00 10 mg via ORAL

## 2018-08-28 MED ORDER — SODIUM CHLORIDE 0.9 % IJ SOLN
50 mL | Freq: Once | INTRAVENOUS | 0 refills | Status: CP
Start: 2018-08-28 — End: ?
  Administered 2018-08-28: 22:00:00 50 mL via INTRAVENOUS

## 2018-08-28 MED ORDER — METOPROLOL TARTRATE 5 MG/5 ML IV SOLN
10 mg | Freq: Once | INTRAVENOUS | 0 refills | Status: DC
Start: 2018-08-28 — End: 2018-08-28

## 2018-08-28 MED ORDER — MORPHINE 2 MG/ML IV SYRG
4 mg | Freq: Once | INTRAVENOUS | 0 refills | Status: DC
Start: 2018-08-28 — End: 2018-08-29

## 2018-08-28 MED ORDER — IOHEXOL 350 MG IODINE/ML IV SOLN
80 mL | Freq: Once | INTRAVENOUS | 0 refills | Status: CP
Start: 2018-08-28 — End: ?
  Administered 2018-08-28: 22:00:00 80 mL via INTRAVENOUS

## 2018-08-28 MED ORDER — PANTOPRAZOLE 40 MG IV SOLR
80 mg | Freq: Every day | INTRAVENOUS | 0 refills | Status: DC
Start: 2018-08-28 — End: 2018-08-28

## 2018-08-28 MED ORDER — FENTANYL CITRATE (PF) 50 MCG/ML IJ SOLN
50 ug | Freq: Once | INTRAVENOUS | 0 refills | Status: CP
Start: 2018-08-28 — End: ?
  Administered 2018-08-28: 22:00:00 50 ug via INTRAVENOUS

## 2018-08-28 MED ORDER — METOPROLOL TARTRATE 5 MG/5 ML IV SOLN
5 mg | Freq: Once | INTRAVENOUS | 0 refills | Status: CP
Start: 2018-08-28 — End: ?
  Administered 2018-08-28: 21:00:00 5 mg via INTRAVENOUS

## 2018-08-28 NOTE — ED Notes
Dr. Bernath at bedside.

## 2018-08-28 NOTE — ED Notes
Dr. Miller at bedside.

## 2018-08-29 DIAGNOSIS — K859 Acute pancreatitis without necrosis or infection, unspecified: Secondary | ICD-10-CM

## 2018-08-29 DIAGNOSIS — R000 Tachycardia, unspecified: Secondary | ICD-10-CM

## 2018-08-29 DIAGNOSIS — I4891 Unspecified atrial fibrillation: Principal | ICD-10-CM

## 2018-08-29 NOTE — ED Notes
Patient requesting to leave at this time. MD aware. Patient signed AMA paperwork and reports he will come back after dealing with a situation at home. Patient is aox4 and states he understands the risk of leaving. Patient ambulated with steady gait off unit to be picked up by ride.

## 2018-09-01 ENCOUNTER — Encounter: Admit: 2018-09-01 | Discharge: 2018-09-01

## 2018-09-01 DIAGNOSIS — K920 Hematemesis: Secondary | ICD-10-CM

## 2018-09-01 DIAGNOSIS — R079 Chest pain, unspecified: Secondary | ICD-10-CM

## 2018-09-01 DIAGNOSIS — J449 Chronic obstructive pulmonary disease, unspecified: Secondary | ICD-10-CM

## 2018-09-01 DIAGNOSIS — Z8719 Personal history of other diseases of the digestive system: Secondary | ICD-10-CM

## 2018-09-01 DIAGNOSIS — I10 Essential (primary) hypertension: Secondary | ICD-10-CM

## 2018-09-01 DIAGNOSIS — I4891 Unspecified atrial fibrillation: Secondary | ICD-10-CM

## 2018-09-01 DIAGNOSIS — K859 Acute pancreatitis without necrosis or infection, unspecified: Secondary | ICD-10-CM

## 2018-09-01 DIAGNOSIS — R1013 Epigastric pain: Secondary | ICD-10-CM

## 2018-09-01 DIAGNOSIS — K759 Inflammatory liver disease, unspecified: Secondary | ICD-10-CM

## 2018-09-01 MED ORDER — FENTANYL CITRATE (PF) 50 MCG/ML IJ SOLN
50 ug | Freq: Once | INTRAVENOUS | 0 refills | Status: CP
Start: 2018-09-01 — End: ?
  Administered 2018-09-02: 04:00:00 50 ug via INTRAVENOUS

## 2018-09-01 MED ORDER — PANTOPRAZOLE 40 MG IV SOLR
80 mg | Freq: Once | INTRAVENOUS | 0 refills | Status: CP
Start: 2018-09-01 — End: ?
  Administered 2018-09-02: 05:00:00 80 mg via INTRAVENOUS

## 2018-09-01 NOTE — Telephone Encounter
VM received from patient's friend, York Cerise, that patient is not doing well.  Stating patient is not coughing up blood as often as he was but is in miserable pain.  Stomach is hard and back is killing him.  Returned phone call and spoke with patient's friend York Cerise, who stated that patient's stomach is rigid in the same place patient had hernia repair with mesh.  Explained that patient needs to go to the ED immediately and be checked out.  This is very serious and should not delay. I reiterated this several times.  She voiced understanding and said she would work on getting him there.  I explained that if this was a complication from the hernia repair it would not get better on its own and as more time goes by the more serious it becomes.  She repeated what I said to patient and patient could be heard verbalizing understanding. Will update Dr Sallyanne Kuster  Trellis Moment, RN

## 2018-09-02 LAB — COVID-19 (SARS-COV-2) PCR

## 2018-09-02 LAB — CBC AND DIFF
Lab: 0 10*3/uL (ref 0–0.20)
Lab: 0 10*3/uL (ref 0–0.45)
Lab: 11 10*3/uL (ref 4.5–11.0)
Lab: 11 g/dL — ABNORMAL LOW (ref 13.5–16.5)
Lab: 13 % — ABNORMAL LOW (ref 24–44)
Lab: 13 10*3/uL — ABNORMAL HIGH (ref 4.5–11.0)
Lab: 15 % — ABNORMAL HIGH (ref 11–15)
Lab: 166 10*3/uL (ref 150–400)
Lab: 3.3 M/UL — ABNORMAL LOW (ref 4.4–5.5)
Lab: 32 % — ABNORMAL LOW (ref 40–50)
Lab: 32 pg (ref 26–34)
Lab: 33 g/dL (ref 32.0–36.0)
Lab: 97 FL (ref 80–100)

## 2018-09-02 LAB — COMPREHENSIVE METABOLIC PANEL
Lab: 105 MMOL/L — ABNORMAL LOW (ref 98–110)
Lab: 110 mg/dL — ABNORMAL HIGH (ref 70–100)
Lab: 139 MMOL/L — ABNORMAL LOW (ref 137–147)
Lab: 0.8 mg/dL — ABNORMAL HIGH (ref 0.4–1.24)
Lab: 0.9 mg/dL (ref 0.3–1.2)
Lab: 104 MMOL/L (ref 98–110)
Lab: 13 mg/dL (ref 7–25)
Lab: 136 MMOL/L — ABNORMAL LOW (ref 137–147)
Lab: 15 U/L (ref 7–40)
Lab: 3.7 g/dL (ref 3.5–5.0)
Lab: 4.4 MMOL/L (ref 3.5–5.1)
Lab: 6.8 g/dL (ref 6.0–8.0)
Lab: 86 mg/dL — ABNORMAL HIGH (ref 70–100)
Lab: 9.2 mg/dL (ref 8.5–10.6)
Lab: 94 U/L (ref 25–110)

## 2018-09-02 LAB — POC LACTATE: Lab: 0.8 MMOL/L (ref 0.5–2.0)

## 2018-09-02 LAB — URINALYSIS, MICROSCOPIC

## 2018-09-02 LAB — URINALYSIS DIPSTICK
Lab: NEGATIVE 10*3/uL — ABNORMAL HIGH (ref 3–12)
Lab: NEGATIVE U/L (ref 7–40)
Lab: NEGATIVE mL/min (ref 1.0–4.8)
Lab: POSITIVE mL/min — AB (ref 0–0.80)

## 2018-09-02 LAB — POC TROPONIN: Lab: 0 ng/mL (ref 0.00–0.05)

## 2018-09-02 LAB — ALCOHOL LEVEL: Lab: <10 mg/dL

## 2018-09-02 LAB — PROTIME INR (PT): Lab: 1.3 — ABNORMAL HIGH (ref 0.8–1.2)

## 2018-09-02 LAB — LIPASE: Lab: 703 U/L — ABNORMAL HIGH (ref 11–82)

## 2018-09-02 MED ORDER — HYDRALAZINE 20 MG/ML IJ SOLN
10 mg | Freq: Once | INTRAVENOUS | 0 refills | Status: CP
Start: 2018-09-02 — End: ?
  Administered 2018-09-02: 14:00:00 10 mg via INTRAVENOUS

## 2018-09-02 MED ORDER — FENTANYL CITRATE (PF) 50 MCG/ML IJ SOLN
50 ug | Freq: Once | INTRAVENOUS | 0 refills | Status: CP
Start: 2018-09-02 — End: ?
  Administered 2018-09-02: 07:00:00 50 ug via INTRAVENOUS

## 2018-09-02 MED ORDER — LACTATED RINGERS IV SOLP
INTRAVENOUS | 0 refills | Status: AC
Start: 2018-09-02 — End: ?
  Administered 2018-09-02 – 2018-09-03 (×5): 1000.000 mL via INTRAVENOUS

## 2018-09-02 MED ORDER — ALBUTEROL SULFATE 90 MCG/ACTUATION IN HFAA
1-2 | RESPIRATORY_TRACT | 0 refills | Status: DC | PRN
Start: 2018-09-02 — End: 2018-09-06

## 2018-09-02 MED ORDER — DILTIAZEM HCL 240 MG PO CP24
240 mg | Freq: Every day | ORAL | 0 refills | Status: DC
Start: 2018-09-02 — End: 2018-09-06
  Administered 2018-09-02 – 2018-09-05 (×4): 240 mg via ORAL

## 2018-09-02 MED ORDER — LIPASE-PROTEASE-AMYLASE (CREON 24,000) 24,000-76,000- 120,000 UNIT PO CPDR
3 | Freq: Three times a day (TID) | ORAL | 0 refills | Status: DC
Start: 2018-09-02 — End: 2018-09-06
  Administered 2018-09-04 – 2018-09-06 (×5): 3 via ORAL

## 2018-09-02 MED ORDER — PATCH DOCUMENTATION - NICOTINE 14 MG/24HR
Freq: Two times a day (BID) | TRANSDERMAL | 0 refills | Status: DC
Start: 2018-09-02 — End: 2018-09-06

## 2018-09-02 MED ORDER — FENTANYL CITRATE (PF) 50 MCG/ML IJ SOLN
50 ug | Freq: Once | INTRAVENOUS | 0 refills | Status: CP
Start: 2018-09-02 — End: ?

## 2018-09-02 MED ORDER — LACTATED RINGERS IV SOLP
1000 mL | INTRAVENOUS | 0 refills | Status: CP
Start: 2018-09-02 — End: ?
  Administered 2018-09-02: 06:00:00 1000 mL via INTRAVENOUS

## 2018-09-02 MED ORDER — ONDANSETRON HCL 4 MG PO TAB
4 mg | ORAL | 0 refills | Status: DC | PRN
Start: 2018-09-02 — End: 2018-09-06

## 2018-09-02 MED ORDER — AZITHROMYCIN 250 MG PO TAB
500 mg | ORAL | 0 refills | Status: DC
Start: 2018-09-02 — End: 2018-09-06
  Administered 2018-09-04 – 2018-09-06 (×2): 500 mg via ORAL

## 2018-09-02 MED ORDER — POLYETHYLENE GLYCOL 3350 17 GRAM PO PWPK
1 | Freq: Every day | ORAL | 0 refills | Status: DC
Start: 2018-09-02 — End: 2018-09-06

## 2018-09-02 MED ORDER — PANTOPRAZOLE 40 MG PO TBEC
40 mg | Freq: Two times a day (BID) | ORAL | 0 refills | Status: DC
Start: 2018-09-02 — End: 2018-09-03
  Administered 2018-09-02 – 2018-09-03 (×2): 40 mg via ORAL

## 2018-09-02 MED ORDER — DULOXETINE 60 MG PO CPDR
60 mg | Freq: Every day | ORAL | 0 refills | Status: DC
Start: 2018-09-02 — End: 2018-09-03
  Administered 2018-09-02: 13:00:00 60 mg via ORAL

## 2018-09-02 MED ORDER — ONDANSETRON HCL (PF) 4 MG/2 ML IJ SOLN
4 mg | INTRAVENOUS | 0 refills | Status: DC | PRN
Start: 2018-09-02 — End: 2018-09-06

## 2018-09-02 MED ORDER — UMECLIDINIUM-VILANTEROL 62.5-25 MCG/ACTUATION IN DSDV
1 | Freq: Every day | RESPIRATORY_TRACT | 0 refills | Status: DC
Start: 2018-09-02 — End: 2018-09-06
  Administered 2018-09-02: 21:00:00 1 via RESPIRATORY_TRACT

## 2018-09-02 MED ORDER — OXYCODONE 5 MG PO TAB
5 mg | ORAL | 0 refills | Status: DC | PRN
Start: 2018-09-02 — End: 2018-09-02

## 2018-09-02 MED ORDER — OXYCODONE 5 MG PO TAB
5-10 mg | ORAL | 0 refills | Status: DC | PRN
Start: 2018-09-02 — End: 2018-09-04
  Administered 2018-09-02 – 2018-09-03 (×5): 10 mg via ORAL
  Administered 2018-09-03: 21:00:00 5 mg via ORAL
  Administered 2018-09-03 – 2018-09-04 (×2): 10 mg via ORAL

## 2018-09-02 MED ORDER — NICOTINE 14 MG/24 HR TD PT24
1 | Freq: Every day | TRANSDERMAL | 0 refills | Status: DC
Start: 2018-09-02 — End: 2018-09-06
  Administered 2018-09-02 – 2018-09-05 (×3): 1 via TRANSDERMAL

## 2018-09-02 MED ORDER — SENNOSIDES 8.6 MG PO TAB
1 | Freq: Two times a day (BID) | ORAL | 0 refills | Status: DC
Start: 2018-09-02 — End: 2018-09-06
  Administered 2018-09-02 – 2018-09-04 (×4): 1 via ORAL

## 2018-09-02 MED ORDER — METOPROLOL TARTRATE 50 MG PO TAB
100 mg | Freq: Two times a day (BID) | ORAL | 0 refills | Status: DC
Start: 2018-09-02 — End: 2018-09-06
  Administered 2018-09-02 – 2018-09-06 (×10): 100 mg via ORAL

## 2018-09-02 MED ADMIN — FENTANYL CITRATE (PF) 50 MCG/ML IJ SOLN [3037]: 50 ug | INTRAVENOUS | @ 05:00:00 | Stop: 2018-09-02 | NDC 00409909412

## 2018-09-02 NOTE — Progress Notes
Progress Note - General Inpatient    Today's Date: 09/02/2018   Name: Alexis Weeks   MRN: [id number]   Admitted: 09/01/2018       Assessment/Plan     Principal Problem:    Hematemesis  Active Problems:    Essential hypertension    Chronic atrial fibrillation    COPD (chronic obstructive pulmonary disease) (HCC)    Hemoptysis      Alexis Weeks is a 68 y.o. 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 Left AMA from ED for same issue on 6/15.    Plan 6/20: c/w po pain control, aggressive IVF.  F/u with GI reccs.  ???  #Pancreatitis  6/15 lipase >3000, 6/19 703, WBC 11.0, HgB 11.6, T Bili 0.8, Ca 9.5  - Patient endorses mid abdominal pain radiating to back, CT Abd 6/15 did not show signs of aortic dissection, but did show peripancreatic fat stranding consistent with pancreatitis.  - 1L Fluid, 80 IV Protonix, fentanyl given in ED  PLAN:  > Patient declines morphine or fentanyl IV as it makes him sick, will do Oxycodone 5mg  q4h to start  > LR 225ml/hr  > CLD, advance as tolerated  > Replace electrolytes as needed  ???  #Hematemasis  #Hemoptysis  - Recent hospitalization in Feb 2020 for similar complaints, GI, ENT, Pulm all consulted. Negative EGD in Jan. ENT did not find bleeding nasally. Old spot seen on CT Chest, bronchoscopy showed old blood and no signs of active bleed.  - COPD Baseline without home oxygen use, smokes 12 cigarettes per day  - CTA chest 6/15 without evidence of aortic dissection, improvement in previously impacted posterior left upper lobe bronchus, mild emphysema and tiny unchanged nodules  - Patient drinks 3-4 beers per day, unknown if retching may have caused hematemasis vs gastric ulcer  - 3 episodes of emesis with dark streaks since leaving AMA from ED, 2 hemoptysis episodes with bright red blood prior to arrival to ed  - Hgb stable at 11.6 on admission  PLAN:  - GI consult  - Maintaine Hgb >7  ???  #HTN  - PTA Metoprolol tartrate 100mg  BID and diltiazem 240mg , continuing both  - Hypertensive in 180s/110s in ED, continued to elevated, med delayed in ED, likely exacerbated by pain  - BP returned to normal with regular dose AM metoprolol morning of 6/20 and 10mg  iv hydralazine  - c/w pta metoprol 100mg  BID, monitor and adjust as needed  ???  ???  #Afib not on anticoagulation  - Patient states he was taken off of anticoagulation as he has had persistent GI bleeds.  - Could consult cardiology to let them know their patient is here, cont PTA Diltiazem  ???  #nicotene dependence  - 12 cigarrettes per day, c/w nicotene patch while inpatient    ALLERGIES: Penicillins    FEN:   Diet: Diet NPO   IVF: 28mL/hr LR   Monitor and replace electrolytes as needed    ACCESS:  Lines: PIV x 2  Urinary Catether: none    Prophylaxis Review:  DVT Prophylaxis:   Mechanical: Sequential compression device   Pharmacological: Contraindication:  Active bleeding; Gastrointestinal bleeding   GI Prophylaxis:   Indication: Indicated by bleeding diathesis (Plt < 50k and/or INR > 1.5 and/or PTT > 2x ULN)  Medications: Ordered PPI  PT/OT: Not indicated  Wounds: None noted on limited MD survey; see RN flowsheet    Code Status:  Full Code    Disposition:  - Barriers to discharge: management of pancreatitis, workup for hematemasis  - Social work and nurse case management involved for discharge planning, appreciate assistance    Discussed with staff physician Dr. Miles Costain, Nonda Lou, *    Arlis Porta, MD  [facility] Medicine PGY-2  Team Pager [phone]       Subjective     Patient seen and examined bedside this morning resting comfortably.  He is still dry in the ED.  Overnight patient's blood pressures were elevated, but subsequently came down with home dose of metoprolol and a single order for 10 mg IV hydralazine.  Patient says his abdominal pain is still present but manageable.  He continues to decline IV opioids due to it making him sick and is agreeable with continuing the p.o. pain medication.  He says he is still been nauseous with food but will try a clear liquid diet as tolerated.  He denies any fevers, chills, chest pain, SOA.    Review of Systems  A review of systems was negative except as noted above.    Medications  Scheduled: [START ON 09/04/2018] azithromycin (ZITHROMAX) tablet 500 mg, 500 mg, Oral, Once per day on Mon Wed Fri  diltiazem CD (cardIZEM CD) capsule 240 mg, 240 mg, Oral, QDAY w/breakfast  duloxetine DR (CYMBALTA) capsule 60 mg, 60 mg, Oral, QDAY w/breakfast  lipase-protease-amylase (CREON 24000) capsule 3 capsule, 3 capsule, Oral, TID w/ meals  metoprolol tartrate (LOPRESSOR) tablet 100 mg, 100 mg, Oral, BID  nicotine (NICODERM CQ STEP 2) 14 mg/day patch 1 patch, 1 patch, Transdermal, QDAY    And  Verification of Patch Placement and Integrity - Nicotine 14 MG/24HR, , Transdermal, BID  pantoprazole DR (PROTONIX) tablet 40 mg, 40 mg, Oral, BID(11-21)  polyethylene glycol 3350 (MIRALAX) packet 17 g, 1 packet, Oral, QDAY  senna (SENOKOT) tablet 1 tablet, 1 tablet, Oral, BID  umeclidinium-vilanteroL (ANORO ELLIPTA) 62.5-25 mcg/actuation inhaler 1 puff, 1 puff, Inhalation, QDAY        PRN: albuterol sulfate Q4H PRN, ondansetron Q6H PRN **OR** ondansetron (ZOFRAN) IV Q6H PRN, oxyCODONE Q4H PRN    Infusions:   ??? lactated ringers infusion 250 mL/hr at 09/02/18 0707       Allergies: Penicillins      Objective     Vitals  Vital Signs:  Last Filed in 24 hours Vital Signs:  24 hour Range    BP: 163/131 (06/20 0747)  Temp: 36.9 ???C (98.5 ???F) (06/19 2028)  Pulse: 89 (06/20 0700)  Respirations: 24 PER MINUTE (06/20 0748)  SpO2: 99 % (06/20 0748)  SpO2 Pulse: 110 (06/20 0748) BP: (154-202)/(87-131)   Temp:  [36.9 ???C (98.5 ???F)]   Pulse:  [89-129]   Respirations:  [20 PER MINUTE-29 PER MINUTE] SpO2:  [98 %-100 %]        GENERAL: No acute distress, appears stated age;  HENT: Normocephalic atraumatic, no nasal discharge, positive for dry mucous membranes;  CARDIOVASCULAR: Regular rate and rhythm, no murmurs/rubs/gallops;  LUNG: Clear to auscultation bilaterally, no wheezes/rales/rhonchi;  ABDOMEN: positive for TTP worse at periumbilical  EXTREMITIES: No clubbing/cyanosis, no calf tenderness, no edema;  NEURO: CN grossly intact, no lateralizing deficits, grossly oriented;    Intake/Output Summary:  (Last 24 hours)  No intake or output data in the 24 hours ending 09/02/18 9811  Labs  Results for orders placed or performed during the hospital encounter of 09/01/18 (from the past 24 hour(s))   CBC AND DIFF    Collection Time: 09/01/18 10:35 PM   # # Low-High    White Blood Cells 11.0 4.5 - 11.0 K/UL    RBC 3.39 (L) 4.4 - 5.5 M/UL    Hemoglobin 11.6 (L) 13.5 - 16.5 GM/DL    Hematocrit 16.1 (L) 40 - 50 %    MCV 98.5 80 - 100 FL    MCH 34.2 (H) 26 - 34 PG    MCHC 34.7 32.0 - 36.0 G/DL    RDW 09.6 (H) 11 - 15 %    Platelet Count 160 150 - 400 K/UL    MPV 8.5 7 - 11 FL    Neutrophils 80 (H) 41 - 77 %    Lymphocytes 11 (L) 24 - 44 %    Monocytes 9 4 - 12 %    Eosinophils 0 0 - 5 %    Basophils 0 0 - 2 %    Absolute Neutrophil Count 8.71 (H) 1.8 - 7.0 K/UL    Absolute Lymph Count 1.20 1.0 - 4.8 K/UL    Absolute Monocyte Count 1.01 (H) 0 - 0.80 K/UL    Absolute Eosinophil Count 0.02 0 - 0.45 K/UL    Absolute Basophil Count 0.04 0 - 0.20 K/UL   COMPREHENSIVE METABOLIC PANEL    Collection Time: 09/01/18 10:35 PM   # # Low-High    Sodium 139 137 - 147 MMOL/L    Potassium 3.6 3.5 - 5.1 MMOL/L    Chloride 105 98 - 110 MMOL/L    Glucose 110 (H) 70 - 100 MG/DL    Blood Urea Nitrogen 16 7 - 25 MG/DL    Creatinine 0.45 0.4 - 1.24 MG/DL    Calcium 9.5 8.5 - 40.9 MG/DL    Total Protein 7.6 6.0 - 8.0 G/DL    Total Bilirubin 0.8 0.3 - 1.2 MG/DL    Albumin 4.2 3.5 - 5.0 G/DL    Alk Phosphatase 811 (H) 25 - 110 U/L AST (SGOT) 14 7 - 40 U/L    CO2 23 21 - 30 MMOL/L    ALT (SGPT) 10 7 - 56 U/L    Anion Gap 11 3 - 12    eGFR Non African American >60 >60 mL/min    eGFR African American >60 >60 mL/min   LIPASE    Collection Time: 09/01/18 10:35 PM   # # Low-High    Lipase 703 (H) 11 - 82 U/L   ALCOHOL LEVEL    Collection Time: 09/01/18 10:35 PM   # # Low-High    Alcohol <10 MG/DL   BLOOD BANK SAMPLE HOLD    Collection Time: 09/01/18 10:35 PM   # # Low-High    BB Sample hold IN LAB    POC TROPONIN    Collection Time: 09/01/18 10:39 PM   # # Low-High    Troponin-I-POC 0.02 0.00 - 0.05 NG/ML   POC LACTATE    Collection Time: 09/01/18 10:41 PM   # # Low-High    LACTIC ACID POC 0.8 0.5 - 2.0 MMOL/L   URINALYSIS DIPSTICK    Collection Time: 09/02/18  1:56 AM   # # Low-High    Color,UA YELLOW     Turbidity,UA CLEAR CLEAR-CLEAR    Specific Gravity-Urine 1.015 1.003 - 1.035    pH,UA 6.0 5.0 - 8.0    Protein,UA 1+ (  A) NEG-NEG    Glucose,UA NEG NEG-NEG    Ketones,UA TRACE (A) NEG-NEG    Bilirubin,UA NEG NEG-NEG    Blood,UA 2+ (A) NEG-NEG    Urobilinogen,UA NORMAL NORM-NORMAL    Nitrite,UA NEG NEG-NEG    Leukocytes,UA NEG NEG-NEG    Urine Ascorbic Acid, UA POS (A) NEG-NEG   URINALYSIS, MICROSCOPIC    Collection Time: 09/02/18  1:56 AM   # # Low-High    WBCs,UA 0-2 0 - 2 /HPF    RBCs,UA 2-10 0 - 3 /HPF    MucousUA TRACE    PROTIME INR (PT)    Collection Time: 09/02/18  4:47 AM   # # Low-High    INR 1.3 (H) 0.8 - 1.2       Microbiology  Resulted Micro Last 72 Hrs    No results found         Imaging  No orders to display       Pathology      Arlis Porta, MD  PGY-2 [facility]

## 2018-09-02 NOTE — H&P (View-Only)
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 He says symptoms continued to bother him. He also has had 2-3 episodes of emesis with dark streaks in it as well as hemoptysis x 2 earlier today. His home health nurse told him he should come back in for evaluation. His lipase is improved to 700's on admission but he is still in a significant amount of pain. He was hypertensive and tachycardic on arrival and was admitted to family medicine for further workup of his abdominal pain likely 2/2 to pancreatitis as well as hemoptysis and hematemesis.       #Pancreatitis  6/15 lipase >3000, 6/19 703, WBC 11.0, HgB 11.6, T Bili 0.8, Ca 9.5  - Patient endorses mid abdominal pain radiating to back, CT Abd 6/15 did not show signs of aortic dissection, but did show peripancreatic fat stranding consistent with pancreatitis.  - 1L Fluid, 80 IV Protonix, fentanyl given in ED  PLAN:   > LR 219ml/hr   > Patient declines morphine, will do Oxycodone 5mg  q4h to start   > GI consult   > NPO but clear liquid as tolerated likely later in the day   > Replace electrolytes as needed    #Hematemasis  #Hemoptysis  - Recent hospitalization in Feb 2020 for similar complaints, GI, ENT, Pulm all consulted. Negative EGD in Jan. ENT did not find bleeding nasally. Old spot seen on CT Chest, bronchoscopy showed old blood and no signs of active bleed.  - COPD Baseline without home oxygen use, smokes 12 cigarettes per day  - CTA chest 6/15 without evidence of aortic dissection, improvement in previously impacted posterior left upper lobe bronchus, mild emphysema and tiny unchanged nodules  - Patient drinks 3-4 beers per day, unknown if retching may have caused hematemasis vs gastric ulcer  - 3 episodes of emesis with dark streaks since leaving AMA from ED, 2 hemoptysis episodes with bright red blood prior to arrival to ed  - Hgb stable at 11.6 on admission  PLAN:   - GI consult   - Maintaine Hgb >7    #HTN  - PTA Metoprolol tartrate 100mg  BID and diltiazem 240mg , continuing both  - Hypertensive in 180s/110s in ED, medication given once patient arrived to floor      #Afib not on anticoagulation  - Patient states he was taken off of anticoagulation as he has had persistent GI bleeds.  -  Could consult cardiology to let them know their patient is here, cont PTA Diltiazem    #nicotene dependence  - 12 cigarrettes per day, will order nicotene patch    FEN:   Diet: Diet NPO   IVF: LR 218ml/hr   Monitor and replace electrolytes as needed    ACCESS:  Lines: 2 Large bore IV  Urinary Catether: none    Prophylaxis Review:  DVT Prophylaxis:   Mechanical: Sequential compression device   Pharmacological: Contraindication:  Active bleeding; Gastrointestinal bleeding   GI Prophylaxis:   Indication: Indicated by bleeding diathesis (Plt < 50k and/or INR > 1.5 and/or PTT > 2x ULN)   Medications: Ordered PPI  PT/OT: Not indicated  Wounds: None noted on limited MD survey; see RN flowsheet    Code Status:  Full Code    Disposition: Admitted on telemetry for further workup of pancreatitis    Patient was seen/discussed with staff physician Dr. Newt Lukes.    Karle Plumber, MD   Family Medicine PGY-1  [facility] __________________________________________________________________________________  Primary Care Physician: Precious Reel  Verified    Chief Complaint:  Abdominal Pain  History of Present Illness: Alexis Weeks is a 68 y.o. 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 He has been hospitalized many times in the past few months for hematemesis and hemoptysis. When he went home he says that he tried to tough it out, but his symptoms continued to bother him. He has  A burning mid abdominal pain with radiation to his back. He says most movements make his belly hurt. He denies fevers or chills, chest pain or shortness of air, but does endorse cough with hemoptysis. He has been constipated for the past three days. He also has had 2-3 episodes of emesis with dark streaks in it as well as hemoptysis x 2 earlier today. His home health nurse told him he should come back in for evaluation.     In the ED, they did not do repeat imaging as it was just done 4 days prior and he was hemodynamically stable, albeit hypertensive and slightly tachycardic. Hemoglobin was stable at 11.6. His lipase is improved to 700's on admission but he is still in a significant amount of pain. He is admitted to family medicine for further workup of his pancreatitis as well as hemoptysis and hematemesis.    Medical History:   Diagnosis Date   ??? A-fib (HCC)    ??? Chest pain     with activity   ??? COPD (chronic obstructive pulmonary disease) (HCC)    ??? Hepatitis     C, treated with pills    ??? History of GI bleed 12/01/2017 [facility] June, [facility].  Upper GI endoscopy did not identify a cause   ??? Hypertension      Surgical History:   Procedure Laterality Date   ??? SPINE SURGERY  2004   ??? HX TURP  2013   ??? HERNIA REPAIR  2014    Ventral Hernia   ??? ESOPHAGOGASTRODUODENOSCOPY ENDOSCOPIC ULTRASOUND N/A 04/01/2017    Performed by Vertell Novak, MD at  [facility] ENDO   ??? BRONCHOSCOPY DIAGNOSTIC WITH/ WITHOUT CELL WASHING - FLEXIBLE N/A 09/12/2017    Performed by Cloretta Ned, MD at [facility] OR   ??? BRONCHOSCOPY WITH BRONCHIAL ALVEOLAR LAVAGE - FLEXIBLE N/A 09/12/2017    Performed by Cloretta Ned, MD at [facility] OR   ??? PERCUTANEOUS CLOSURE LEFT ATRIAL APPENDAGE WITH ENDOCARDIAL IMPLANT, TRANSEPTAL CATHETERIZATION, FOLEY, CONTRAST ANTICIPATED N/A 12/26/2017    Performed by Deniece Ree, MD at [facility] CVOR   ??? TRANSESOPHAGEAL ECHOCARDIOGRAM DURING INTERVENTION N/A 12/26/2017    Performed by Vena Austria, MD at [facility] CVOR   ??? ESOPHAGOGASTRODUODENOSCOPY WITH SPECIMEN COLLECTION BY BRUSHING/ WASHING N/A 02/21/2018    Performed by Samuel Jester, MD at [facility] ENDO   ??? BRONCHOSCOPY WITH BRONCHIAL/ ENDOBRONCHIAL BIOPSY - FLEXIBLE N/A 04/24/2018    Performed by Audie Box, MD at [facility] OR   ??? BRONCHOSCOPY WITH BRONCHIAL ALVEOLAR LAVAGE - FLEXIBLE  04/24/2018    Performed by Audie Box, MD at [facility] OR   ??? CYSTOURETHROSCOPY WITH FULGURATION/ RESECTION BLADDER TUMOR N/A 05/22/2018    Performed by Barbra Sarks, MD at [facility] OR   ??? FRACTURE SURGERY  1970s    arm   ??? HX CHOLECYSTECTOMY  1970s   ??? INGUINAL HERNIA REPAIR Bilateral 1950s     Family History   Problem Relation Age of Onset   ??? Arthritis Mother    ??? Hearing Loss Mother    ??? Alcohol abuse Father    ??? COPD Sister    ??? Alcohol abuse Brother      Social History     Socioeconomic History   ??? Marital status: Divorced     Spouse name: Not on file   ??? Number of children: Not on file   ??? Years of education: Not on file   ??? Highest education level: Not on file   Occupational History   ??? Not on file Social Needs   ??? Financial resource strain: Not on file   ??? Food insecurity     Worry: Not on file     Inability: Not on file   ??? Transportation needs     Medical: Not on file     Non-medical: Not on file   Tobacco Use   ??? Smoking status: Current Every Day Smoker     Packs/day: 0.50     Years: 51.00     Pack years: 25.50     Types: Cigarettes   ??? Smokeless tobacco: Never Used   Substance and Sexual Activity   ??? Alcohol use: Yes     Alcohol/week: 2.0 standard drinks     Types: 2 Cans of beer per week     Frequency: Never   ??? Drug use: Not Currently     Types: Marijuana, Methamphetamines, Cocaine, Heroin     Comment: stopped street drugs in 2002    ??? Sexual activity: Not on file   Lifestyle   ??? Physical activity     Days per week: Not on file     Minutes per session: Not on file   ??? Stress: Not on file   Relationships   ??? Social Wellsite geologist on phone: Not on file     Gets together: Not on file     Attends religious service: Not on file     Active member of club or organization: Not on file     Attends meetings of clubs or organizations: Not on file  Relationship status: Not on file   ??? Intimate partner violence     Fear of current or ex partner: Not on file     Emotionally abused: Not on file     Physically abused: Not on file     Forced sexual activity: Not on file   Other Topics Concern   ??? Not on file   Social History Narrative   ??? Not on file            Immunizations (includes history and patient reported):   Immunization History   Administered Date(s) Administered   ??? Flu Vaccine =>65 YO High-Dose (PF) 12/14/2017   ??? Pneumococcal Vaccine(13-Val Peds/immunocompromised adult) 12/14/2017           Allergies:  Penicillins    Medications:  (Not in a hospital admission)    Current Facility-Administered Medications   Medication   ??? albuterol sulfate (PROAIR HFA) inhaler 1-2 puff   ??? [START ON 09/04/2018] azithromycin (ZITHROMAX) tablet 500 mg   ??? diltiazem CD (cardIZEM CD) capsule 240 mg ??? duloxetine DR (CYMBALTA) capsule 60 mg   ??? lactated ringers infusion   ??? lipase-protease-amylase (CREON 24000) capsule 3 capsule   ??? metoprolol tartrate (LOPRESSOR) tablet 100 mg   ??? nicotine (NICODERM CQ STEP 2) 14 mg/day patch 1 patch    And   ??? Verification of Patch Placement and Integrity - Nicotine 14 MG/24HR   ??? ondansetron (ZOFRAN) tablet 4 mg    Or   ??? ondansetron (ZOFRAN) injection 4 mg   ??? oxyCODONE (ROXICODONE) tablet 5-10 mg   ??? pantoprazole DR (PROTONIX) tablet 40 mg   ??? polyethylene glycol 3350 (MIRALAX) packet 17 g   ??? senna (SENOKOT) tablet 1 tablet   ??? umeclidinium-vilanteroL (ANORO ELLIPTA) 62.5-25 mcg/actuation inhaler 1 puff     Current Outpatient Medications   Medication Sig   ??? acetaminophen (TYLENOL) 325 mg tablet Take two tablets by mouth every 6 hours as needed.   ??? albuterol (PROAIR HFA, VENTOLIN HFA, OR PROVENTIL HFA) 90 mcg/actuation inhaler Inhale 1-2 puffs by mouth into the lungs every 6 hours as needed for Wheezing or Shortness of Breath. Shake well before use.   ??? aspirin 81 mg chewable tablet Chew one tablet by mouth daily. Take with food.   ??? azithromycin (ZITHROMAX) 250 mg tablet Take two tablets by mouth three times weekly.   ??? cholecalciferol (VITAMIN D3) 2,000 unit tablet Take 2,000 Units by mouth daily.   ??? cyclobenzaprine (FLEXERIL) 10 mg tablet Take 10 mg by mouth three times daily as needed for Muscle Cramps.   ??? diltiazem CD (CARDIZEM CD) 240 mg capsule Take one capsule by mouth daily. (Patient taking differently: Take 240 mg by mouth daily with breakfast.)   ??? duloxetine DR (CYMBALTA) 60 mg capsule Take 60 mg by mouth daily with breakfast.   ??? gabapentin (NEURONTIN) 300 mg capsule Take 300 mg by mouth every 8 hours as needed.   ??? metoprolol tartrate (LOPRESSOR) 100 mg tablet Take 100 mg by mouth twice daily.   ??? oxybutynin chloride (DITROPAN) 5 mg tablet Take one tablet by mouth three times daily as needed. ??? oxycodone (ROXICODONE) 20 mg tablet Take 20 mg by mouth every 6 hours as needed     ??? oxyCODONE/acetaminophen (ENDOCET) 5/325 mg tablet Take one tablet by mouth every 4 hours as needed   ??? pancrelipase (CREON 24,000) 24,000-76,000 -120,000 units capsule Take three capsules by mouth three times daily with meals. Take 2 tabs by  mouth twice daily with snacks. (Patient taking differently: Take 1 capsule by mouth twice daily with meals.)   ??? phenazopyridine (PYRIDIUM) 100 mg tablet Take one tablet by mouth three times daily as needed. Take after meals for up to 2 days.   ??? umeclidinium-vilanterol (ANORO ELLIPTA) 62.5-25 mcg/actuation inhaler Inhale one puff by mouth into the lungs daily.     Review of Systems:  Review of Systems   Constitutional: Negative for chills and fever.   HENT: Negative for nosebleeds and trouble swallowing.    Respiratory: Positive for cough. Negative for chest tightness, shortness of breath and wheezing.    Cardiovascular: Negative for chest pain, palpitations and leg swelling.   Gastrointestinal: Positive for abdominal pain, nausea and vomiting. Negative for constipation and diarrhea.   Genitourinary: Negative for difficulty urinating.   Musculoskeletal: Negative.    Skin: Negative.    Neurological: Negative for weakness, numbness and headaches.   Psychiatric/Behavioral: Negative.          Physical Exam:  Vital Signs: Last Filed In 24 Hours Vital Signs: 24 Hour Range   BP: 162/105 (06/20 0130)  Temp: 36.9 ???C (98.5 ???F) (06/19 2028)  Pulse: 109 (06/20 0259)  Respirations: 26 PER MINUTE (06/20 0300)  SpO2: 99 % (06/20 0300)  SpO2 Pulse: 105 (06/20 0300) BP: (162-202)/(87-108)   Temp:  [36.9 ???C (98.5 ???F)]   Pulse:  [99-129]   Respirations:  [20 PER MINUTE-26 PER MINUTE]   SpO2:  [98 %-100 %]    Intensity Pain Scale (Self Report): 9 (09/02/18 0130)      Physical Exam  Vitals signs and nursing note reviewed.   Constitutional:       Appearance: He is well-developed and normal weight. He is not diaphoretic.   HENT:      Head: Normocephalic.      Mouth/Throat:      Pharynx: Oropharynx is clear.   Eyes:      Conjunctiva/sclera: Conjunctivae normal.   Neck:      Musculoskeletal: Normal range of motion.   Cardiovascular:      Rate and Rhythm: Tachycardia present. Rhythm irregular.   Pulmonary:      Effort: Pulmonary effort is normal. No respiratory distress.   Abdominal:      General: Bowel sounds are normal.      Palpations: There is no shifting dullness or fluid wave.      Tenderness: There is generalized abdominal tenderness and tenderness in the epigastric area. There is no guarding.   Musculoskeletal: Normal range of motion.   Skin:     General: Skin is warm and dry.      Findings: No rash.   Neurological:      Mental Status: He is alert and oriented to person, place, and time.   Psychiatric:         Mood and Affect: Mood normal.         Behavior: Behavior normal.         Thought Content: Thought content normal.         Judgment: Judgment normal.         Lab/Radiology/Other Diagnostic Tests:  24-hour labs:    Results for orders placed or performed during the hospital encounter of 09/01/18 (from the past 24 hour(s))   CBC AND DIFF    Collection Time: 09/01/18 10:35 PM   Result Value Ref Range    White Blood Cells 11.0 4.5 - 11.0 K/UL    RBC 3.39 (L) 4.4 - 5.5  M/UL    Hemoglobin 11.6 (L) 13.5 - 16.5 GM/DL    Hematocrit 16.1 (L) 40 - 50 %    MCV 98.5 80 - 100 FL    MCH 34.2 (H) 26 - 34 PG    MCHC 34.7 32.0 - 36.0 G/DL    RDW 09.6 (H) 11 - 15 %    Platelet Count 160 150 - 400 K/UL    MPV 8.5 7 - 11 FL    Neutrophils 80 (H) 41 - 77 %    Lymphocytes 11 (L) 24 - 44 %    Monocytes 9 4 - 12 %    Eosinophils 0 0 - 5 %    Basophils 0 0 - 2 %    Absolute Neutrophil Count 8.71 (H) 1.8 - 7.0 K/UL    Absolute Lymph Count 1.20 1.0 - 4.8 K/UL    Absolute Monocyte Count 1.01 (H) 0 - 0.80 K/UL    Absolute Eosinophil Count 0.02 0 - 0.45 K/UL    Absolute Basophil Count 0.04 0 - 0.20 K/UL   COMPREHENSIVE METABOLIC PANEL Collection Time: 09/01/18 10:35 PM   Result Value Ref Range    Sodium 139 137 - 147 MMOL/L    Potassium 3.6 3.5 - 5.1 MMOL/L    Chloride 105 98 - 110 MMOL/L    Glucose 110 (H) 70 - 100 MG/DL    Blood Urea Nitrogen 16 7 - 25 MG/DL    Creatinine 0.45 0.4 - 1.24 MG/DL    Calcium 9.5 8.5 - 40.9 MG/DL    Total Protein 7.6 6.0 - 8.0 G/DL    Total Bilirubin 0.8 0.3 - 1.2 MG/DL    Albumin 4.2 3.5 - 5.0 G/DL    Alk Phosphatase 811 (H) 25 - 110 U/L    AST (SGOT) 14 7 - 40 U/L    CO2 23 21 - 30 MMOL/L    ALT (SGPT) 10 7 - 56 U/L    Anion Gap 11 3 - 12    eGFR Non African American >60 >60 mL/min    eGFR African American >60 >60 mL/min   LIPASE    Collection Time: 09/01/18 10:35 PM   Result Value Ref Range    Lipase 703 (H) 11 - 82 U/L   ALCOHOL LEVEL    Collection Time: 09/01/18 10:35 PM   Result Value Ref Range    Alcohol <10 MG/DL   BLOOD BANK SAMPLE HOLD    Collection Time: 09/01/18 10:35 PM   Result Value Ref Range    BB Sample hold IN LAB    POC TROPONIN    Collection Time: 09/01/18 10:39 PM   Result Value Ref Range    Troponin-I-POC 0.02 0.00 - 0.05 NG/ML   POC LACTATE    Collection Time: 09/01/18 10:41 PM   Result Value Ref Range    LACTIC ACID POC 0.8 0.5 - 2.0 MMOL/L   URINALYSIS DIPSTICK    Collection Time: 09/02/18  1:56 AM   Result Value Ref Range    Color,UA YELLOW     Turbidity,UA CLEAR CLEAR-CLEAR    Specific Gravity-Urine 1.015 1.003 - 1.035    pH,UA 6.0 5.0 - 8.0    Protein,UA 1+ (A) NEG-NEG    Glucose,UA NEG NEG-NEG    Ketones,UA TRACE (A) NEG-NEG    Bilirubin,UA NEG NEG-NEG    Blood,UA 2+ (A) NEG-NEG    Urobilinogen,UA NORMAL NORM-NORMAL    Nitrite,UA NEG NEG-NEG    Leukocytes,UA NEG NEG-NEG    Urine  Ascorbic Acid, UA POS (A) NEG-NEG   URINALYSIS, MICROSCOPIC    Collection Time: 09/02/18  1:56 AM   Result Value Ref Range    WBCs,UA 0-2 0 - 2 /HPF    RBCs,UA 2-10 0 - 3 /HPF    MucousUA TRACE    PROTIME INR (PT)    Collection Time: 09/02/18  4:47 AM   Result Value Ref Range    INR 1.3 (H) 0.8 - 1.2 Glucose: (!) 110 (09/01/18 2235)  Pertinent radiology reviewed., EKG Reviewed   No orders to display     6/15 CTA Chest/Abd/Pelv  IMPRESSION      CHEST:  1. Normal caliber thoracic aorta without evidence of dissection or   intramural hematoma.  2. Improvement in impacted posterior left upper lobe bronchus and   improvement in surrounding groundglass opacities. Additional follow-up CT   chest in 3-6 months is recommended for reevaluation.  3. Mild emphysema. Additional tiny nodules are unchanged.  4. Mild cardiomegaly and calcific coronary artery disease. ???    ABDOMEN AND PELVIS:  1. 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 No peripancreatic fluid collection. 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  2. No aortoiliac dissection. 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 Wide patency of the major visceral arteries.  3. Trace pelvic ascites, likely reactive. No bowel obstruction or   pneumoperitoneum.  4. Diverticulosis without evidence of diverticulitis.  5. Mildly nodular liver with fissural widening suggestive of early   cirrhosis. ???    Karle Plumber, MD  Pager [phone]

## 2018-09-02 NOTE — ED Notes
Arriving in apparent satisfactory condition via POV. CC: coughing up blood for years. "it usually starts up when I stop drinking". Reports being here for same complaint last week, but leaving AMA.  "I'm back because it didn't get any better". Reports constipation x2-3 days    Belongings: clothing on person, teeth, mask

## 2018-09-02 NOTE — ED Notes
Report from Sandy, RN.  This RN to assume pt care.

## 2018-09-02 NOTE — ED Notes
Pt A&Ox4 resting in bed, respirations even and non-labored. Skin is warm, dry, intact, and appropriate for ethnicity. Pt hooked up to monitors. Bed in lowest locked position, call light within reach. Pt states he is currently in 6/10 pain in his "gut" that never goes away.

## 2018-09-02 NOTE — ED Notes
Pt pushed in wheelchair via his niece to cafeteria. Both pt and niece educated and verbally stated back they understood pt could only have clear liquids. Pt and niece agreed to be back in 20 minutes and not exit wheelchair. Pt is dressed in appropriate green gown according to protocol.

## 2018-09-02 NOTE — Consults
Gastroenterology Consult Note  Patient Name:Alexis Weeks         ZOX:[id number]  Admission Date: 09/01/2018 10:01 PM      Principal Problem:    Hematemesis  Active Problems:    Essential hypertension    Chronic atrial fibrillation    COPD (chronic obstructive pulmonary disease) (HCC)    Hemoptysis      History of Present Illness/Subjective:  Alexis Weeks is a 68 y.o. 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    Pronounced LEE-JAIRE The patient is well-known to the GI service and has seen Dr. Keenan Bachelor before for chronic pancreatitis and abnormal imaging of the pancreas.  He has chronic cough with hemoptysis but developed severe epigastric abdominal pain with associated nausea and vomiting several days ago.  He came into the emergency department on 08/28/2018 and was found to have a lipase greater than 3000 but then left AMA.  Appetite was still poor and the pain persisted so he came back in.  He denies any melena or hematochezia.  He has not had any evidence of bleeding since arriving to the hospital.  It was difficult to pin down specifics regarding his bleeding.  On one hand he says it is coughing up blood but then later states that it was throwing up.  He is just not able to tell me which one definitively.  He does continue to have epigastric pain and nausea and has not been eating very well for the last few days, eating will precipitate nausea and emesis. He did sustain weight loss initially to about 129 pounds back in October 2019 but has since gained back some weight and is around 144 pounds.  He does not have any heartburn or dysphagia.    He continues to smoke about 15 cigarettes a day has been smoking for at least the past 3 decades.  He was a heavy drinker of alcohol until quitting in 1985 but has started drinking again although not daily. Again the specifics are vague.        Assessment/ Plan:    1. Acute and chronic pancreatitis  1. Saw Dr. Keenan Bachelor in clinic for 8 mm uncinate process cyst seen on 08/2016 MRI in the setting of weight loss.  2. EUS 03/2017 showed chronic pancreatitis PD was 3.7 mm in the head, 1.5 mm in the body; no mass in the pancreas, small 6 mm uncinate process cyst was smooth and thin-walled  3. Admitted with acute pancreatitis 6/20, lipase greater than 3000, CT with evidence of acute and chronic pancreatitis.  Rounded calcification within the PD just proximal to the ampulla, PD 6 mm  2. ?Hematemesis vs. hemoptysis  1. Patient reported. History of hemoptysis related to bronchiectasis.   2. Outpatient EGD 02/2018 for hematemesis versus hemoptysis showed no blood and otherwise was a normal exam  3. Alcohol misuse  4. Tobacco misuse  5. History of hepatitis C s/p treatment and SVR  1. Treated by Dr. Rosalio Macadamia with mavyret   6. S/p cholecystectomy  7. COPD  8. History of colon polyps  1. Last colonoscopy reportedly 2017 with 3 adenomas removed    Recommendations:  Patient has acute and chronic pancreatitis which is likely related to his tobacco and alcohol use. Has a PD stone. He is off pancreatic enzyme replacement questionable history of hemoptysis versus hematemesis, recently had a negative EGD 6 months  ago for a similar clinical history.  No plans for EGD unless he develops evidence of GI bleeding.  We always have an increase concern for gastric varices related to splenic vein thrombosis in patients with chronic pancreatitis but there was no evidence of these findings last December.  He does have a history of alcohol misuse and hepatitis C but does not have thrombocytopenia nor splenomegaly. INR 1.3.     -Supportive treatment for acute pancreatitis, continue IV fluids 261mL/hr, pain control, antiemetics  -Full liquid diet, advance as tolerated to low fat diet (early enteral nutrition)  -Reasonable to start PPI daily -Administer vitamin K 10 mg p.o. x3 days and repeat INR to see if this corrects  -When eating consistently, restart pancrelipase 500 units/kg per meal, 250 with snacks  -Observe for evidence of GI bleeding, trend CBC BID   -Smoking cessation is as equally as important as alcohol cessation for prevention of recurrent pancreatitis    Patient will be discussed with Dr. Keenan Bachelor by telephone  Zenaida Niece, MD  GI/Hepatology Fellow [phone]    -----------------------------  PMH:  Medical History:   Diagnosis Date   ??? A-fib (HCC)    ??? Chest pain     with activity   ??? COPD (chronic obstructive pulmonary disease) (HCC)    ??? Hepatitis     C, treated with pills    ??? History of GI bleed 12/01/2017    At [facility] in June, 2019.  Upper GI endoscopy did not identify a cause   ??? Hypertension        Current medications:  No current facility-administered medications on file prior to encounter.      Current Outpatient Medications on File Prior to Encounter   Medication Sig Dispense Refill   ??? acetaminophen (TYLENOL) 325 mg tablet Take two tablets by mouth every 6 hours as needed. 30 tablet 0   ??? albuterol (PROAIR HFA, VENTOLIN HFA, OR PROVENTIL HFA) 90 mcg/actuation inhaler Inhale 1-2 puffs by mouth into the lungs every 6 hours as needed for Wheezing or Shortness of Breath. Shake well before use.     ??? aspirin 81 mg chewable tablet Chew one tablet by mouth daily. Take with food. 90 tablet 3   ??? azithromycin (ZITHROMAX) 250 mg tablet Take two tablets by mouth three times weekly. 156 tablet 1   ??? cholecalciferol (VITAMIN D3) 2,000 unit tablet Take 2,000 Units by mouth daily.     ??? cyclobenzaprine (FLEXERIL) 10 mg tablet Take 10 mg by mouth three times daily as needed for Muscle Cramps.     ??? diltiazem CD (CARDIZEM CD) 240 mg capsule Take one capsule by mouth daily. (Patient taking differently: Take 240 mg by mouth daily with breakfast.) 90 capsule 3 ??? duloxetine DR (CYMBALTA) 60 mg capsule Take 60 mg by mouth daily with breakfast.     ??? gabapentin (NEURONTIN) 300 mg capsule Take 300 mg by mouth every 8 hours as needed.     ??? metoprolol tartrate (LOPRESSOR) 100 mg tablet Take 100 mg by mouth twice daily.     ??? oxybutynin chloride (DITROPAN) 5 mg tablet Take one tablet by mouth three times daily as needed. 30 tablet 3   ??? oxycodone (ROXICODONE) 20 mg tablet Take 20 mg by mouth every 6 hours as needed       ??? oxyCODONE/acetaminophen (ENDOCET) 5/325 mg tablet Take one tablet by mouth every 4 hours as needed 5 tablet 0   ??? pancrelipase (CREON 24,000) 24,000-76,000 -  120,000 units capsule Take three capsules by mouth three times daily with meals. Take 2 tabs by mouth twice daily with snacks. (Patient taking differently: Take 1 capsule by mouth twice daily with meals.) 390 capsule 5   ??? phenazopyridine (PYRIDIUM) 100 mg tablet Take one tablet by mouth three times daily as needed. Take after meals for up to 2 days. 6 tablet 0   ??? umeclidinium-vilanterol (ANORO ELLIPTA) 62.5-25 mcg/actuation inhaler Inhale one puff by mouth into the lungs daily. 1 each 11       PSH:  Surgical History:   Procedure Laterality Date   ??? SPINE SURGERY  2004   ??? HX TURP  2013   ??? HERNIA REPAIR  2014    Ventral Hernia   ??? ESOPHAGOGASTRODUODENOSCOPY ENDOSCOPIC ULTRASOUND N/A 04/01/2017    Performed by Vertell Novak, MD at [facility] ENDO   ??? BRONCHOSCOPY DIAGNOSTIC WITH/ WITHOUT CELL WASHING - FLEXIBLE N/A 09/12/2017    Performed by Cloretta Ned, MD at [facility] OR   ??? BRONCHOSCOPY WITH BRONCHIAL ALVEOLAR LAVAGE - FLEXIBLE N/A 09/12/2017    Performed by Cloretta Ned, MD at [facility] OR   ??? PERCUTANEOUS CLOSURE LEFT ATRIAL APPENDAGE WITH ENDOCARDIAL IMPLANT, TRANSEPTAL CATHETERIZATION, FOLEY, CONTRAST ANTICIPATED N/A 12/26/2017    Performed by Deniece Ree, MD at [facility] CVOR   ??? TRANSESOPHAGEAL ECHOCARDIOGRAM DURING INTERVENTION N/A 12/26/2017    Performed by Vena Austria, MD at [facility] CVOR ??? ESOPHAGOGASTRODUODENOSCOPY WITH SPECIMEN COLLECTION BY BRUSHING/ WASHING N/A 02/21/2018    Performed by Samuel Jester, MD at [facility] ENDO   ??? BRONCHOSCOPY WITH BRONCHIAL/ ENDOBRONCHIAL BIOPSY - FLEXIBLE N/A 04/24/2018    Performed by Audie Box, MD at [facility] OR   ??? BRONCHOSCOPY WITH BRONCHIAL ALVEOLAR LAVAGE - FLEXIBLE  04/24/2018    Performed by Audie Box, MD at [facility] OR   ??? CYSTOURETHROSCOPY WITH FULGURATION/ RESECTION BLADDER TUMOR N/A 05/22/2018    Performed by Barbra Sarks, MD at [facility] OR   ??? FRACTURE SURGERY  1970s    arm   ??? HX CHOLECYSTECTOMY  1970s   ??? INGUINAL HERNIA REPAIR Bilateral 1950s       SH:  Social History     Socioeconomic History   ??? Marital status: Divorced     Spouse name: Not on file   ??? Number of children: Not on file   ??? Years of education: Not on file   ??? Highest education level: Not on file   Occupational History   ??? Not on file   Tobacco Use   ??? Smoking status: Current Every Day Smoker     Packs/day: 0.50     Years: 51.00     Pack years: 25.50     Types: Cigarettes   ??? Smokeless tobacco: Never Used   Substance and Sexual Activity   ??? Alcohol use: Yes     Alcohol/week: 2.0 standard drinks     Types: 2 Cans of beer per week     Frequency: Never   ??? Drug use: Not Currently     Types: Marijuana, Methamphetamines, Cocaine, Heroin     Comment: stopped street drugs in 2002    ??? Sexual activity: Not on file   Other Topics Concern   ??? Not on file   Social History Narrative   ??? Not on file       FH:  Family History   Problem Relation Age of Onset   ??? Arthritis Mother    ??? Hearing Loss Mother    ???  Alcohol abuse Father    ??? COPD Sister    ??? Alcohol abuse Brother        Review of Systems:  Constitutional: ++weight loss  Eyes: No vision deficit, no icterus  Ears, nose, mouth: No oral bleeding, ulcer  Cardiovascular: No chest pain, palpitations  Respiratory: ++dyspnea, cough   Gastrointestinal: ++abdominal pain, no blood in stool  Musculoskeltal: No joint inflammation, new deformity Integumentary: No rashes, exudate  Neurologic: No cognitive change, focal weakness  Hematologic: No for easy bleeding or bruising  Please see HPI for additional pertinent documentation    Physical Exam:  Vitals:    09/02/18 0700 09/02/18 0732 09/02/18 0747 09/02/18 0748   BP:   (!) 163/131    Pulse: 89      Temp:       SpO2: 99% 98%  99%   Weight:         Constitutional- Vitals above, mild acute distress due to pain.  Chronically ill-appearing, thin  Head - Normocephalic, atraumatic.   Eyes -  EOMI grossly. No icterus or injection.   Ears, nose, mouth, throat- No oral ulcer or bleeding.   Neck - No swelling or tracheal deviation.   Respiratory - Symmetric chest rise, no increased work of breathing.  Cardiovascular - Peripheral pulses intact, no pedal edema.  Gastrointestinal-diffusely TTP, especially epigastrium. No hepatospenomegaly.   Skin - No exposed rash, lesion.  Neurologic - No CN deficit, normal fluid speech  Psychiatric -tangential conversation, pleasant    Labs/Imaging:  Pertient labs/imaging were reviewed on initiation of progress note.

## 2018-09-02 NOTE — Progress Notes
RT Adult Assessment Note    NAME:Alexis Weeks             MRN: [id number]             DOB:1950-09-30          AGE: 68 y.o.  ADMISSION DATE: 09/01/2018             DAYS ADMITTED: LOS: 0 days    RT Treatment Plan:  Protocol Plan: Medications  Albuterol: MDI PRN  Anoro (Home regimen only): Qday         Additional Comments:  Impressions of the patient: no resp distress  Intervention(s)/outcome(s): RT assessment, Anoro qday and albuterol MDI prn  Patient education that was completed: none  Recommendations to the care team: none    Vital Signs:  Pulse: 77  RR: 13 PER MINUTE  SpO2: 98 %  O2 Device:    Liter Flow:    O2%: 21 %  Breath Sounds: Clear (Implies normal)  Respiratory Effort: Non-Labored

## 2018-09-03 LAB — CBC AND DIFF
Lab: 0 10*3/uL (ref 0–0.20)
Lab: 0 10*3/uL (ref 0–0.45)
Lab: 1 % (ref 0–2)
Lab: 1 % (ref >60)
Lab: 1.1 10*3/uL — ABNORMAL HIGH (ref 0–0.80)
Lab: 1.3 10*3/uL (ref 1.0–4.8)
Lab: 10 % (ref >60)
Lab: 11 10*3/uL — ABNORMAL HIGH (ref 4.5–11.0)
Lab: 13 % — ABNORMAL LOW (ref 24–44)
Lab: 15 % — ABNORMAL HIGH (ref 11–15)
Lab: 3.1 M/UL — ABNORMAL LOW (ref 4.4–5.5)
Lab: 30 % — ABNORMAL LOW (ref 40–50)
Lab: 33 pg (ref 26–34)
Lab: 75 % (ref 41–77)
Lab: 8.4 10*3/uL — ABNORMAL HIGH (ref 1.8–7.0)
Lab: 8.4 FL (ref 7–11)
Lab: 98 FL — ABNORMAL HIGH (ref 80–100)

## 2018-09-03 LAB — COMPREHENSIVE METABOLIC PANEL
Lab: 104 MMOL/L — ABNORMAL LOW (ref >60)
Lab: 136 MMOL/L — ABNORMAL LOW (ref 137–147)

## 2018-09-03 MED ORDER — FENTANYL CITRATE (PF) 50 MCG/ML IJ SOLN
50-100 ug | INTRAVENOUS | 0 refills | Status: DC | PRN
Start: 2018-09-03 — End: 2018-09-04
  Administered 2018-09-03: 16:00:00 50 ug via INTRAVENOUS
  Administered 2018-09-03 – 2018-09-04 (×5): 100 ug via INTRAVENOUS

## 2018-09-03 MED ORDER — FENTANYL CITRATE (PF) 50 MCG/ML IJ SOLN
50 ug | INTRAVENOUS | 0 refills | Status: DC | PRN
Start: 2018-09-03 — End: 2018-09-03
  Administered 2018-09-03: 14:00:00 50 ug via INTRAVENOUS

## 2018-09-03 MED ORDER — PANTOPRAZOLE 40 MG PO TBEC
40 mg | Freq: Every day | ORAL | 0 refills | Status: DC
Start: 2018-09-03 — End: 2018-09-06
  Administered 2018-09-04 – 2018-09-05 (×2): 40 mg via ORAL

## 2018-09-03 NOTE — Progress Notes
Progress Note - General Inpatient    Today's Date: 09/03/2018   Name: Alexis Weeks   MRN: [id number]   Admitted: 09/01/2018       Assessment/Plan     Principal Problem:    Hematemesis  Active Problems:    Essential hypertension    Chronic atrial fibrillation    COPD (chronic obstructive pulmonary disease) (HCC)    Hemoptysis      Romano Stigger is a 68 y.o. 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 Left AMA from ED for same issue on 6/15.    Plan 6/20: c/w po pain control, aggressive IVF.  F/u with GI reccs.  ???  #Pancreatitis  Elevated lipase (>3000) and CT scan with findings consistent with pancreatitis upon presentation to the ER.    PLAN:  > Fentanyl 50-152mcg q2hrs PRN, continue home oxycodone 5-10mg  q4hrs PRN  > LR 213ml/hr  > CLD, advance as tolerated  > Replace electrolytes as needed  ???  #Hematemasis  #Hemoptysis  - Recent hospitalization in Feb 2020 for similar complaints, GI, ENT, Pulm all consulted. Negative EGD in Jan. ENT did not find bleeding nasally. Old spot seen on CT Chest, bronchoscopy showed old blood and no signs of active bleed.  - COPD Baseline without home oxygen use, smokes 12 cigarettes per day  - CTA chest 6/15 without evidence of aortic dissection, improvement in previously impacted posterior left upper lobe bronchus, mild emphysema and tiny unchanged nodules  - Patient drinks 3-4 beers per day, unknown if retching may have caused hematemasis vs gastric ulcer  - 3 episodes of emesis with dark streaks since leaving AMA from ED, 2 hemoptysis episodes with bright red blood prior to arrival to ed  - Hgb stable at 11.6 on admission, no additional episodes since admission.  Stable hgb.   PLAN:  - Continue to monitor   - Maintain Hgb >7  ???  #HTN  #Afib not on anticoagulation - PTA Metoprolol tartrate 100mg  BID and diltiazem 240mg , continuing both  ???- Patient states he was taken off of anticoagulation as he has had persistent GI bleeds.  ???  #nicotene dependence  - 12 cigarrettes per day, c/w nicotene patch while inpatient    ALLERGIES: Penicillins    FEN:   Diet: DIET CLEAR LIQUID   IVF: 263mL/hr LR   Monitor and replace electrolytes as needed    ACCESS:  Lines: PIV x 2  Urinary Catether: none    Prophylaxis Review:  DVT Prophylaxis:   Mechanical: Sequential compression device   Pharmacological: Contraindication:  Active bleeding; Gastrointestinal bleeding   GI Prophylaxis:   Indication: Indicated by bleeding diathesis (Plt < 50k and/or INR > 1.5 and/or PTT > 2x ULN)   Medications: Ordered PPI  PT/OT: Not indicated  Wounds: None noted on limited MD survey; see RN flowsheet    Code Status:  Full Code    Disposition:  - Barriers to discharge: management of pancreatitis, workup for hematemasis  - Social work and nurse case management involved for discharge planning, appreciate assistance    Discussed with staff physician Dr. Newt Lukes, Jimmie Molly, MD    Kateri Plummer, MD  Family Medicine PGY-3  Team Pager [phone]       Subjective     Patient reports increased pain in his back and shoulder  today.  Pain starts in his abdomen and radiates to his back.  Has tried to drink but not having good success with this.  Discussing pain medication with the patient he says that he would not like morphine because that makes him feel off however however is okay having fentanyl. His concern over having this medication was bad stories of people dying from fentanyl on the street. Otherwise is denying chest pain, shortness of breath, vomiting or any additional hematemesis.      Review of Systems  A review of systems was negative except as noted above.    Medications  Scheduled: [START ON 09/04/2018] azithromycin (ZITHROMAX) tablet 500 mg, 500 mg, Oral, Once per day on Mon Wed Fri diltiazem CD (cardIZEM CD) capsule 240 mg, 240 mg, Oral, QDAY w/breakfast  duloxetine DR (CYMBALTA) capsule 60 mg, 60 mg, Oral, QDAY w/breakfast  lipase-protease-amylase (CREON 24000) capsule 3 capsule, 3 capsule, Oral, TID w/ meals  metoprolol tartrate (LOPRESSOR) tablet 100 mg, 100 mg, Oral, BID  nicotine (NICODERM CQ STEP 2) 14 mg/day patch 1 patch, 1 patch, Transdermal, QDAY    And  Verification of Patch Placement and Integrity - Nicotine 14 MG/24HR, , Transdermal, BID  pantoprazole DR (PROTONIX) tablet 40 mg, 40 mg, Oral, BID(11-21)  polyethylene glycol 3350 (MIRALAX) packet 17 g, 1 packet, Oral, QDAY  senna (SENOKOT) tablet 1 tablet, 1 tablet, Oral, BID  umeclidinium-vilanteroL (ANORO ELLIPTA) 62.5-25 mcg/actuation inhaler 1 puff, 1 puff, Inhalation, QDAY        PRN: albuterol sulfate Q4H PRN, ondansetron Q6H PRN **OR** ondansetron (ZOFRAN) IV Q6H PRN, oxyCODONE Q4H PRN    Infusions:   ??? lactated ringers infusion 250 mL/hr at 09/02/18 0707       Allergies: Penicillins      Objective     Vitals  Vital Signs:  Last Filed in 24 hours Vital Signs:  24 hour Range    BP: 129/77 (06/21 0454)  Temp: 36.8 ???C (98.2 ???F) (06/21 0454)  Pulse: 85 (06/21 0603)  Respirations: 16 PER MINUTE (06/21 0603)  SpO2: 94 % (06/21 0603)  SpO2 Pulse: 65 (06/20 1359) BP: (108-163)/(63-131)   Temp:  [36.3 ???C (97.4 ???F)-36.8 ???C (98.2 ???F)]   Pulse:  [66-90]   Respirations:  [13 PER MINUTE-29 PER MINUTE]   SpO2:  [94 %-100 %]        GENERAL: No acute distress, appears stated age;  HENT: Normocephalic atraumatic, no nasal discharge,   CARDIOVASCULAR: Regular rate and rhythm, no murmurs/rubs/gallops;  LUNG: Clear to auscultation bilaterally, no wheezes/rales/rhonchi;  ABDOMEN: positive for TTP worse at periumbilical  EXTREMITIES: No clubbing/cyanosis, no calf tenderness, no edema;  NEURO: CN grossly intact, no lateralizing deficits, grossly oriented;

## 2018-09-03 NOTE — Progress Notes
Patient/family refused high fall risk bundle.      RN provided education on the importance of the high fall risk bundle.    RN escalated high fall risk to Primary Physician Team.    Patient continued to refuse high fall risk bundle Yes     Individualized safety plan implemented Yes     Specify safety plan: use of walker, will call for help if needed, close to RN station

## 2018-09-03 NOTE — Progress Notes
1800: Patient notified nurse that he was wanting to meet his caregiver outside and get some air since he has been stuck in buildings the past several days and he is not used to it. Nurse educated him on medications given to him on the floor and in the ED may cause lightheadedness and dizziness and it was not advisable to leave the unit since we would not be able to monitor him. He was also noted to have an impaired gait using furniture to steady himself. He noted that he normally used a walker or cane at home. A walker was provided to him but still insisted to leave for a short time and come back. The charge nurse was notified and further education provided. He agreed to sign a waiver stating he knows the risks of being outside of our supervision. He left the floor about 1840 and returned around 1920. He did state he became dizzy outside and had to sit for a little bit to be okay but was able to walk safely back to his room. No changes in mentation or behavior observed and no apparent injuries or wounds. Passed along pertinent information to oncoming nurse, Jinny Blossom, for hand-off.

## 2018-09-04 ENCOUNTER — Encounter: Admit: 2018-09-04 | Discharge: 2018-09-04

## 2018-09-04 DIAGNOSIS — I10 Essential (primary) hypertension: Secondary | ICD-10-CM

## 2018-09-04 DIAGNOSIS — J449 Chronic obstructive pulmonary disease, unspecified: Secondary | ICD-10-CM

## 2018-09-04 DIAGNOSIS — R079 Chest pain, unspecified: Secondary | ICD-10-CM

## 2018-09-04 DIAGNOSIS — I4891 Unspecified atrial fibrillation: Secondary | ICD-10-CM

## 2018-09-04 DIAGNOSIS — K759 Inflammatory liver disease, unspecified: Secondary | ICD-10-CM

## 2018-09-04 DIAGNOSIS — Z8719 Personal history of other diseases of the digestive system: Secondary | ICD-10-CM

## 2018-09-04 LAB — CBC AND DIFF: Lab: 10 K/UL — ABNORMAL LOW (ref 4.5–11.0)

## 2018-09-04 LAB — COMPREHENSIVE METABOLIC PANEL: Lab: 137 MMOL/L — ABNORMAL LOW (ref >60)

## 2018-09-04 MED ORDER — FENTANYL PCA 500 MCG/50 ML SYR (STD CONC)(ADULT)(PREMADE)
INTRAVENOUS | 0 refills | Status: DC
Start: 2018-09-04 — End: 2018-09-04
  Administered 2018-09-04: 07:00:00 50.000 mL via INTRAVENOUS

## 2018-09-04 MED ORDER — NALOXONE 0.4 MG/ML IJ SOLN
.08 mg | INTRAVENOUS | 0 refills | Status: DC | PRN
Start: 2018-09-04 — End: 2018-09-06

## 2018-09-04 MED ORDER — OXYCODONE 10 MG PO TAB
10 mg | ORAL | 0 refills | Status: DC
Start: 2018-09-04 — End: 2018-09-05
  Administered 2018-09-04 – 2018-09-05 (×3): 10 mg via ORAL

## 2018-09-04 MED ORDER — OXYCODONE-ACETAMINOPHEN 5-325 MG PO TAB
1 | ORAL | 0 refills | Status: DC | PRN
Start: 2018-09-04 — End: 2018-09-05
  Administered 2018-09-04: 22:00:00 1 via ORAL

## 2018-09-04 NOTE — Progress Notes
Patient off unit for smoke break and to get food. Telemetry on standby and IV covered with gauze and tegaderm. Smoking waiver signed.

## 2018-09-04 NOTE — Progress Notes
Patient returned to unit at [facility]

## 2018-09-04 NOTE — Progress Notes
Progress Note - General Inpatient    Today's Date: 09/04/2018   Name: Alexis Weeks   MRN: [id number]   Admitted: 09/01/2018       Assessment/Plan     Principal Problem:    Hematemesis  Active Problems:    Essential hypertension    Chronic atrial fibrillation    COPD (chronic obstructive pulmonary disease) (HCC)    Hemoptysis      Sergi Pickron is a 68 y.o. male admitted for treatment of pancreatitis. We are continuing to work on pain control and will advance his diet this am.  ???  #Pancreatitis  Elevated lipase (>3000) and CT scan with findings consistent with pancreatitis upon presentation to the ER.    PLAN:  > Will DC Fentanyl PCA, continue home oxycodone 5-10mg  q4hrs PRN with Basal Oxycodone 10mg  q6hrs  > Changing CLD to Low Fat as evidence supports shorter hospital stay with this  > Replace electrolytes as needed  ???  #Hematemasis  #Hemoptysis  - Recent hospitalization in Feb 2020 for similar complaints, GI, ENT, Pulm all consulted. Negative EGD in Jan. ENT did not find bleeding nasally. Old spot seen on CT Chest, bronchoscopy showed old blood and no signs of active bleed.  - COPD Baseline without home oxygen use, smokes 12 cigarettes per day  - CTA chest 6/15 without evidence of aortic dissection, improvement in previously impacted posterior left upper lobe bronchus, mild emphysema and tiny unchanged nodules  - Patient drinks 3-4 beers per day, unknown if retching may have caused hematemasis vs gastric ulcer  - 3 episodes of emesis with dark streaks since leaving AMA from ED, 2 hemoptysis episodes with bright red blood prior to arrival to ed  - Hgb stable at 11.6 on admission, no additional episodes since admission.  Stable hgb.   PLAN:  - Continue to monitor   - Maintain Hgb >7  ???  #HTN  #Afib not on anticoagulation  - PTA Metoprolol tartrate 100mg  BID and diltiazem 240mg , continuing both  ???- Patient states he was taken off of anticoagulation as he has had persistent GI bleeds.  ??? #nicotene dependence  - 12 cigarrettes per day, c/w nicotene patch while inpatient    ALLERGIES: Penicillins    FEN:   Diet: Low-Fat Diet   IVF: None   Monitor and replace electrolytes as needed    ACCESS:  Lines: PIV x 2  Urinary Catether: none    Prophylaxis Review:  DVT Prophylaxis:   Mechanical: Sequential compression device   Pharmacological: Contraindication:  Active bleeding; Gastrointestinal bleeding   GI Prophylaxis:   Indication: Indicated by bleeding diathesis (Plt < 50k and/or INR > 1.5 and/or PTT > 2x ULN)   Medications: Ordered PPI  PT/OT: Not indicated  Wounds: None noted on limited MD survey; see RN flowsheet    Code Status:  Full Code    Disposition:  - Barriers to discharge: management of pancreatitis, workup for hematemasis  - Social work and nurse case management involved for discharge planning, appreciate assistance    Discussed with staff physician Dr. Linus Orn T. Primitivo Gauze, M.D.  Resident Physician, PGY-2  Department of Family Medicine  [facility]  Pager [phone]      Subjective     Patient reports abdominal pain continues today with little improvement. Otherwise is denying chest pain, shortness of breath, vomiting or any additional hematemesis.      Review of Systems  A review of systems was negative  except as noted above.    Medications  Scheduled: azithromycin (ZITHROMAX) tablet 500 mg, 500 mg, Oral, Once per day on Mon Wed Fri  diltiazem CD (cardIZEM CD) capsule 240 mg, 240 mg, Oral, QDAY w/breakfast  lipase-protease-amylase (CREON 24000) capsule 3 capsule, 3 capsule, Oral, TID w/ meals  metoprolol tartrate (LOPRESSOR) tablet 100 mg, 100 mg, Oral, BID  nicotine (NICODERM CQ STEP 2) 14 mg/day patch 1 patch, 1 patch, Transdermal, QDAY    And  Verification of Patch Placement and Integrity - Nicotine 14 MG/24HR, , Transdermal, BID  pantoprazole DR (PROTONIX) tablet 40 mg, 40 mg, Oral, QDAY(21)  polyethylene glycol 3350 (MIRALAX) packet 17 g, 1 packet, Oral, QDAY senna (SENOKOT) tablet 1 tablet, 1 tablet, Oral, BID  umeclidinium-vilanteroL (ANORO ELLIPTA) 62.5-25 mcg/actuation inhaler 1 puff, 1 puff, Inhalation, QDAY        PRN: albuterol sulfate Q4H PRN, naloxone PRN, ondansetron Q6H PRN **OR** ondansetron (ZOFRAN) IV Q6H PRN    Infusions:   ??? fentaNYL (SUBLIMAZE) PCA  500 mcg/ NS 50 mL infusion syr (std conc)(premade)         Allergies: Penicillins      Objective     Vitals  Vital Signs:  Last Filed in 24 hours Vital Signs:  24 hour Range    BP: 132/76 (06/22 0440)  Temp: 37.1 ???C (98.7 ???F) (06/22 0440)  Pulse: 133 (06/22 0617)  Respirations: 20 PER MINUTE (06/22 0617)  SpO2: 93 % (06/22 0617) BP: (126-136)/(61-82)   Temp:  [36.5 ???C (97.7 ???F)-37.1 ???C (98.7 ???F)]   Pulse:  [79-133]   Respirations:  [16 PER MINUTE-20 PER MINUTE]   SpO2:  [93 %-99 %]        GENERAL: No acute distress, appears stated age;  HENT: Normocephalic atraumatic, no nasal discharge,   CARDIOVASCULAR: Regular rate and rhythm, no murmurs/rubs/gallops;  LUNG: Clear to auscultation bilaterally, no wheezes/rales/rhonchi;  ABDOMEN: positive for TTP worse at periumbilical  EXTREMITIES: No clubbing/cyanosis, no calf tenderness, no edema;  NEURO: CN grossly intact, no lateralizing deficits, grossly oriented;

## 2018-09-04 NOTE — Progress Notes
Patient returned to unit at 1850

## 2018-09-04 NOTE — Progress Notes
Patient off unit at 1825 to walk his brother down who was visiting.

## 2018-09-05 LAB — CBC AND DIFF
Lab: 0 % (ref >60)
Lab: 0 10*3/uL (ref 0–0.20)
Lab: 0.8 10*3/uL (ref 0–0.80)
Lab: 1 % (ref >60)
Lab: 1.3 10*3/uL (ref 1.0–4.8)
Lab: 11 g/dL — ABNORMAL LOW (ref >60)
Lab: 13 % — ABNORMAL LOW (ref 24–44)
Lab: 15 % — ABNORMAL HIGH (ref 11–15)
Lab: 201 10*3/uL (ref 150–400)
Lab: 3.3 M/UL — ABNORMAL LOW (ref 4.4–5.5)
Lab: 32 % — ABNORMAL LOW (ref >60)
Lab: 34 g/dL (ref 32.0–36.0)
Lab: 34 pg (ref 26–34)
Lab: 7.6 10*3/uL — ABNORMAL HIGH (ref 1.8–7.0)
Lab: 78 % — ABNORMAL HIGH (ref 41–77)
Lab: 8 % (ref 4–12)
Lab: 8.2 FL (ref 7–11)
Lab: 9.9 K/UL — ABNORMAL LOW (ref 4.5–11.0)
Lab: 98 FL (ref 80–100)

## 2018-09-05 LAB — COMPREHENSIVE METABOLIC PANEL
Lab: 138 MMOL/L (ref 137–147)
Lab: 3.8 MMOL/L (ref 3.5–5.1)

## 2018-09-05 MED ORDER — OXYCODONE 10 MG PO TAB
10 mg | ORAL | 0 refills | Status: DC | PRN
Start: 2018-09-05 — End: 2018-09-05
  Administered 2018-09-05: 16:00:00 10 mg via ORAL

## 2018-09-05 MED ORDER — OXYCODONE 10 MG PO TAB
20 mg | ORAL | 0 refills | Status: DC | PRN
Start: 2018-09-05 — End: 2018-09-06
  Administered 2018-09-05: 17:00:00 10 mg via ORAL
  Administered 2018-09-05 – 2018-09-06 (×3): 20 mg via ORAL

## 2018-09-05 MED ORDER — LISINOPRIL 10 MG PO TAB
10 mg | Freq: Every day | ORAL | 0 refills | Status: DC
Start: 2018-09-05 — End: 2018-09-06
  Administered 2018-09-05: 15:00:00 10 mg via ORAL

## 2018-09-05 NOTE — Progress Notes
09/05/18 0452 09/05/18 0531   Vitals   BP (!) 189/86  (BP x2, RN noted ) (!) 177/96   Mean NBP (Calculated) 120 MM HG 123 MM HG       0540: MD Matharoo notified. No new orders at this time.     Will continue to monitor.

## 2018-09-05 NOTE — Progress Notes
Progress Note - General Inpatient    Today's Date: 09/05/2018   Name: Alexis Weeks   MRN: [id number]   Admitted: 09/01/2018       Assessment/Plan     Principal Problem:    Hematemesis  Active Problems:    Essential hypertension    Chronic atrial fibrillation    COPD (chronic obstructive pulmonary disease) (HCC)    Hemoptysis      Alexis Weeks is a 68 y.o. 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 Left AMA from ED for same issue on 6/15.  ???  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 Keenan Bachelor 01/26/2018, then lost to followup  - f/u with GI outpatient, will request appointment at discharge  ???  #Hematemasis  #Hemoptysis  - Recent hospitalization in Feb 2020 for similar complaints, GI, ENT, Pulm all consulted. Negative EGD in Jan. ENT did not find bleeding nasally. Old spot seen on CT Chest, bronchoscopy showed old blood and no signs of active bleed.  - COPD Baseline without home oxygen use, smokes 12 cigarettes per day  - CTA chest 6/15 without evidence of aortic dissection, improvement in previously impacted posterior left upper lobe bronchus, mild emphysema and tiny unchanged nodules - Patient drinks 3-4 beers per day, unknown if retching may have caused hematemasis vs gastric ulcer  - 3 episodes of emesis with dark streaks since leaving AMA from ED, 2 hemoptysis episodes with bright red blood prior to arrival to ed  - Hgb stable at 11.6 on admission, no additional episodes since admission.  Stable hgb.   - GI Consult - no plans for EGD unless develops evidence of GI bleed, neg EGD in January  PLAN:  - Continue to monitor   - Maintain Hgb >7  - notify GI if evidence of an active GI bleed  ???  #HTN  #Afib not on anticoagulation  - PTA Metoprolol tartrate 100mg  BID and diltiazem 240mg , continuing both  - Patient states he was taken off of anticoagulation as he has had persistent GI bleeds.  - BP elevated today to 189/86, rechecked 177/96, given Diltiazem 240mg  and Metoprolol 100mg  early  - Restart PTA Lisinopril 10 mg QD  - Continue to monitor  ???  #nicotene dependence  - 12 cigarrettes per day, c/w nicotene patch while inpatient    #?Drug seeking behavior  - Reports only given a few oxycodone recently to help with this acute abdominal pain, seemed to be indicating he would need an rx on discharge, however PMP shows he gets monthly refills and has for a while.  - will not discharge with additional opioids  ALLERGIES: Penicillins    FEN:   Diet: Low-Fat Diet   IVF: None   Monitor and replace electrolytes as needed    ACCESS:  Lines: PIV x 2  Urinary Catether: none    Prophylaxis Review:  DVT Prophylaxis:   Mechanical: Sequential compression device   Pharmacological: Contraindication:  Active bleeding; Gastrointestinal bleeding   GI Prophylaxis:   Indication: Indicated by history of GI ulcer/bleeding within the past year   Medications: Ordered PPI  PT/OT: Ordered, appreciate recommendations  Wounds: None noted on limited MD survey; see RN flowsheet    Code Status:  Full Code    Disposition:  - Barriers to discharge: monitor on pta pain regimen, able to tolerate po diet, PT eval/reccs - Social work and nurse case management involved for discharge planning, appreciate assistance    Discussed with Dr. Joseph Berkshire, Dr. Marlis Edelson, MS3    ATTESTATION    I personally performed or re-performed the history, physical exam and treatment plan for the E/M. I discussed the case with the Medical Student, and concur with the Medical Student documentation of history, physical exam and treatment plan unless otherwise noted.    Resident name:  Arlis Porta, MD Date:  09/05/2018       Subjective     Patient resting in bed comfortably. Reports abdominal pain continues today with little improvement. Requesting additional pain medication but did not take full dose of Oxycodone and Percocet available to him yesterday. Was able to start eating yesterday and tolerated well. Was under the impression that he was placed back on CLD and has not eaten yet today, encouraged to continue to eat as tolerated. Walking outside to smoke multiple times a day. BP elevated overnight and this morning at 189/96, given Diltiazem 240mg  and Metoprolol 100mg  early and restarting home Lisinopril 10mg . Reports nausea, otherwise is denying chest pain, cough, shortness of breath, vomiting or any additional hematemesis.    Review of Systems  A review of systems was negative except as noted above.    Medications  Scheduled: azithromycin (ZITHROMAX) tablet 500 mg, 500 mg, Oral, Once per day on Mon Wed Fri  diltiazem CD (cardIZEM CD) capsule 240 mg, 240 mg, Oral, QDAY w/breakfast  lipase-protease-amylase (CREON 24000) capsule 3 capsule, 3 capsule, Oral, TID w/ meals  lisinopriL (ZESTRIL) tablet 10 mg, 10 mg, Oral, QDAY  metoprolol tartrate (LOPRESSOR) tablet 100 mg, 100 mg, Oral, BID  nicotine (NICODERM CQ STEP 2) 14 mg/day patch 1 patch, 1 patch, Transdermal, QDAY    And  Verification of Patch Placement and Integrity - Nicotine 14 MG/24HR, , Transdermal, BID  pantoprazole DR (PROTONIX) tablet 40 mg, 40 mg, Oral, QDAY(21) polyethylene glycol 3350 (MIRALAX) packet 17 g, 1 packet, Oral, QDAY  senna (SENOKOT) tablet 1 tablet, 1 tablet, Oral, BID  umeclidinium-vilanteroL (ANORO ELLIPTA) 62.5-25 mcg/actuation inhaler 1 puff, 1 puff, Inhalation, QDAY        PRN: albuterol sulfate Q4H PRN, naloxone PRN, ondansetron Q6H PRN **OR** ondansetron (ZOFRAN) IV Q6H PRN, oxyCODONE Q6H PRN    Infusions:       Allergies: Penicillins      Objective     Vitals  Vital Signs:  Last Filed in 24 hours Vital Signs:  24 hour Range    BP: 167/87 (06/23 0834)  Temp: 36.7 ???C (98 ???F) (06/23 1610)  Pulse: 93 (06/23 0913)  Respirations: 18 PER MINUTE (06/23 0834)  SpO2: 97 % (06/23 0834)  Height: 182.9 cm (72.01) (06/22 1110) BP: (  131-189)/(61-96)   Temp:  [36.6 ???C (97.9 ???F)-36.9 ???C (98.4 ???F)]   Pulse:  [61-112]   Respirations:  [16 PER MINUTE-18 PER MINUTE]   SpO2:  [92 %-97 %]        GENERAL: No acute distress, appears stated age;  HENT: Normocephalic atraumatic, no nasal discharge,   CARDIOVASCULAR: Regular rate and rhythm, no murmurs/rubs/gallops;  LUNG: Clear to auscultation bilaterally, no wheezes/rales/rhonchi;  ABDOMEN: positive for TTP worse at epigastric region, radiating to back  EXTREMITIES: No clubbing/cyanosis, no calf tenderness, no edema;  NEURO: CN grossly intact, no lateralizing deficits, grossly oriented;    Intake/Output:    Intake/Output Summary (Last 24 hours) at 09/05/2018 1007  Last data filed at 09/05/2018 0913  Gross per 24 hour   Intake 640 ml   Output 0 ml   Net 640 ml       Labs:  Results for orders placed or performed during the hospital encounter of 09/01/18 (from the past 48 hour(s))   CBC AND DIFF    Collection Time: 09/04/18  4:28 AM   # # Low-High    White Blood Cells 10.6 4.5 - 11.0 K/UL    RBC 2.91 (L) 4.4 - 5.5 M/UL    Hemoglobin 9.9 (L) 13.5 - 16.5 GM/DL    Hematocrit 09.8 (L) 40 - 50 %    MCV 97.9 80 - 100 FL    MCH 33.9 26 - 34 PG    MCHC 34.7 32.0 - 36.0 G/DL    RDW 11.9 (H) 11 - 15 %    Platelet Count 155 150 - 400 K/UL MPV 8.4 7 - 11 FL    Neutrophils 77 41 - 77 %    Lymphocytes 12 (L) 24 - 44 %    Monocytes 10 4 - 12 %    Eosinophils 0 0 - 5 %    Basophils 1 0 - 2 %    Absolute Neutrophil Count 8.11 (H) 1.8 - 7.0 K/UL    Absolute Lymph Count 1.27 1.0 - 4.8 K/UL    Absolute Monocyte Count 1.11 (H) 0 - 0.80 K/UL    Absolute Eosinophil Count 0.04 0 - 0.45 K/UL    Absolute Basophil Count 0.05 0 - 0.20 K/UL   COMPREHENSIVE METABOLIC PANEL    Collection Time: 09/04/18  4:28 AM   # # Low-High    Sodium 137 137 - 147 MMOL/L    Potassium 3.7 3.5 - 5.1 MMOL/L    Chloride 104 98 - 110 MMOL/L    Glucose 85 70 - 100 MG/DL    Blood Urea Nitrogen 16 7 - 25 MG/DL    Creatinine 1.47 0.4 - 1.24 MG/DL    Calcium 8.9 8.5 - 82.9 MG/DL    Total Protein 6.3 6.0 - 8.0 G/DL    Total Bilirubin 0.8 0.3 - 1.2 MG/DL    Albumin 3.5 3.5 - 5.0 G/DL    Alk Phosphatase 93 25 - 110 U/L    AST (SGOT) 15 7 - 40 U/L    CO2 21 21 - 30 MMOL/L    ALT (SGPT) 8 7 - 56 U/L    Anion Gap 12 3 - 12    eGFR Non African American >60 >60 mL/min    eGFR African American >60 >60 mL/min     Microbiology:  Resulted Micro Last 24 Hrs    No results found         Isidor Holts, MS3

## 2018-09-05 NOTE — Progress Notes
BP elevated, notified by nurse, will give BP meds early and CTM

## 2018-09-05 NOTE — Care Plan
Problem: Respiratory Impairment (Non-Ventilated Patient)  Goal: Effective gas exchange  Outcome: Goal Ongoing     Problem: Falls, High Risk of  Goal: Absence of falls-Adult Patient  Outcome: Goal Ongoing  Goal: Absence of Falls-Pediatric patient  Outcome: Goal Ongoing     Problem: Pain  Goal: Management of pain  Outcome: Goal Ongoing  Goal: Knowledge of pain management  Outcome: Goal Ongoing

## 2018-09-05 NOTE — Case Management (ED)
Case Management Admission Assessment    NAME:Alexis Weeks                          MRN: [id number]             DOB:04-14-1950          AGE: 68 y.o.  ADMISSION DATE: 09/01/2018             DAYS ADMITTED: LOS: 3 days      Today???s Date: 09/05/2018    Source of Information: Patient       Plan  Plan: Case Management Assessment, Assist PRN with SW/NCM Services, Discharge Planning for Home Anticipated  This CM spoke with pt via telephone for assessment on this date due to COVID-19.  Provided explanation of SW/NCM roles.  Provided opportunity for questions and discussion. Pt/family encouraged to contact Case Management team with questions and concerns during hospitalization and until patient is able to transition back to the patient's primary care physician.  ??? Patient lives alone in a high rise apartment and reports independence with all ADLs. Pt receives assistance with IADLs from his HCBS caregiver through [employer].  ??? Pt denied previous HH, NH, SNF, or IRF.  ??? Pt denied any CM needs at this time.  ??? Transportation: pt's brother, nephew, or medicaid.  ??? Anticipate d/c to home when medically stable.    Patient Address/Phone  [address]  [address]  [place] [address]  [phone] (home)     Emergency Contact  Extended Emergency Contact Information  Primary Emergency Contact: Cloke,Thomas  Home Phone: [phone]  Mobile Phone: [phone]  Relation: Brother  Interpreter needed? No  Secondary Emergency Contact: caregiver, Tacy Dura Phone: [phone]  Relation: Friend    Forensic scientist: No, patient does not have a healthcare directive  Would patient like to fill out a (a new) Healthcare Directive?: No, patient declined  Psych Advance Directive (Psych unit only): No, patient does not have a Social research officer, government  Does the patient need discharge transport arranged?: No  Transportation Name, Phone and Availability #1: pt's brother, family member, or medicaid  Does the patient use Medicaid Transportation?: Yes    Expected Discharge Date  Expected Discharge Date: 09/06/18  Expected Discharge Time: 1200    Living Situation Prior to Admission  ? Living Arrangements  Type of Residence: Home, dependent on others  Living Arrangements: Alone  Financial risk analyst / Tub: Tub/Shower Unit  How many levels in the residence?: 1  Can patient live on one level if needed?: Yes  Does residence have entry and/or side stairs?: Yes(elevator)  Assistance needed prior to admit or anticipated on discharge: Yes  Who provides assistance or could if needed?: pt's HCBS caregiver  Are they in good health?: Unknown  Can support system provide 24/7 care if needed?: No  ? Level of Function   Prior level of function: Independent  ? Cognitive Abilities   Cognitive Abilities: Alert and Oriented, Engages in problem solving and planning, Participates in decision making    Financial Resources  ? Coverage  Primary Insurance: Medicaid(UHC)  Secondary Insurance: No insurance  Additional Coverage: RX    ? Source of Income   Source Of Income: SSI  ? Financial Assistance Needed?  Pt denied.    Psychosocial Needs  ? Mental Health  Mental Health History: No  ? 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 Other  None    Current/Previous Services  ? PCP  Precious Reel, [phone], [phone]  ? Pharmacy    [facility] [place] [place] - [address]  [address]  [place] [address]  Phone: [phone] Fax: [phone]    [facility]  [address]  [place] [address]  Phone: [phone] Fax: [phone]    [facility] [phone] [facility] [place],  - [address] AT [facility] OF [address]  [address]  [place] [address]  Phone: [phone] Fax: [phone]    ? Durable Psychologist, educational at home: Grab bars, Nurse, adult, [employer], [employer], Toilet riser  ? Home Health  Receiving home health: No  ? Hemodialysis or Peritoneal Dialysis  Undergoing hemodialysis or peritoneal dialysis: No  ? Tube/Enteral Feeds  Receive tube/enteral feeds: No  ? Infusion  Receive infusions: No  ? Private Duty  Private duty help used: No  ? Home and Community Based Services  Home and community based services: Yes  Agency name: [employer]  ? [facility] [employer]: N/A  ? Hospice  Hospice: No  ? Outpatient Therapy  PT: No  OT: No  SLP: No  ? Skilled Nursing Facility/Nursing Home  SNF: No  NH: No  ? Inpatient Rehab  IPR: No  ? Long-Term Acute Care Hospital  LTACH: No  ? Acute Hospital Stay  Acute Hospital Stay: In the past  Was patient's stay within the last 30 days?: No    Juanita Laster, BSN, RN  Integrated Nurse Case Manager  [employer] [facility] [employer] [phone] - Fax [phone] - [email or web address]  [address] [place] [address]

## 2018-09-05 NOTE — Progress Notes
RT Adult Assessment Note    NAME:Alexis Weeks             MRN: [id number]             DOB:[birth year]          AGE: 68 y.o.  ADMISSION DATE: 09/01/2018             DAYS ADMITTED: LOS: 3 days    RT Treatment Plan:  Protocol Plan: Medications  Albuterol: MDI PRN  Anoro (Home regimen only): Qday    Protocol Plan: Procedures  PAP: Place a nursing order for "IS Q1h While Awake" for any of Lung Expansion indicators    Additional Comments:  Impressions of the patient: No apparent distress  Intervention(s)/outcome(s): None  Patient education that was completed: None  Recommendations to the care team: None    Vital Signs:  Pulse: 112  RR: 18 PER MINUTE  SpO2: 93 %  O2 Device:    Liter Flow:    O2%: 21 %  Breath Sounds:  Clear but fine crackles in the bases  Respiratory Effort: Non-Labored

## 2018-09-06 ENCOUNTER — Encounter: Admit: 2018-09-02 | Discharge: 2018-09-06 | Disposition: A | Discharge status: Home / Self Care

## 2018-09-06 ENCOUNTER — Encounter: Admit: 2018-09-06 | Discharge: 2018-09-06

## 2018-09-06 DIAGNOSIS — Z8551 Personal history of malignant neoplasm of bladder: Secondary | ICD-10-CM

## 2018-09-06 DIAGNOSIS — I482 Chronic atrial fibrillation, unspecified: Secondary | ICD-10-CM

## 2018-09-06 DIAGNOSIS — K5790 Diverticulosis of intestine, part unspecified, without perforation or abscess without bleeding: Secondary | ICD-10-CM

## 2018-09-06 DIAGNOSIS — Z79899 Other long term (current) drug therapy: Secondary | ICD-10-CM

## 2018-09-06 DIAGNOSIS — J449 Chronic obstructive pulmonary disease, unspecified: Secondary | ICD-10-CM

## 2018-09-06 DIAGNOSIS — F17210 Nicotine dependence, cigarettes, uncomplicated: Secondary | ICD-10-CM

## 2018-09-06 DIAGNOSIS — Z1159 Encounter for screening for other viral diseases: Secondary | ICD-10-CM

## 2018-09-06 DIAGNOSIS — Z765 Malingerer [conscious simulation]: Secondary | ICD-10-CM

## 2018-09-06 DIAGNOSIS — K858 Other acute pancreatitis without necrosis or infection: Secondary | ICD-10-CM

## 2018-09-06 DIAGNOSIS — R079 Chest pain, unspecified: Secondary | ICD-10-CM

## 2018-09-06 DIAGNOSIS — I2510 Atherosclerotic heart disease of native coronary artery without angina pectoris: Secondary | ICD-10-CM

## 2018-09-06 DIAGNOSIS — Z9049 Acquired absence of other specified parts of digestive tract: Secondary | ICD-10-CM

## 2018-09-06 DIAGNOSIS — Z7982 Long term (current) use of aspirin: Secondary | ICD-10-CM

## 2018-09-06 DIAGNOSIS — I10 Essential (primary) hypertension: Secondary | ICD-10-CM

## 2018-09-06 DIAGNOSIS — R000 Tachycardia, unspecified: Secondary | ICD-10-CM

## 2018-09-06 LAB — CBC AND DIFF
Lab: 0 % (ref 0–5)
Lab: 0 10*3/uL (ref 0–0.20)
Lab: 0 10*3/uL (ref 0–0.45)
Lab: 0.9 10*3/uL — ABNORMAL HIGH (ref 0–0.80)
Lab: 1 % (ref 0–2)
Lab: 1.5 10*3/uL (ref 1.0–4.8)
Lab: 10 % (ref >60)
Lab: 10 g/dL — ABNORMAL LOW (ref 13.5–16.5)
Lab: 15 % — ABNORMAL HIGH (ref 11–15)
Lab: 16 % — ABNORMAL LOW (ref >60)
Lab: 210 10*3/uL (ref 150–400)
Lab: 29 % — ABNORMAL LOW (ref 40–50)
Lab: 3 M/UL — ABNORMAL LOW (ref 4.4–5.5)
Lab: 33 pg (ref 26–34)
Lab: 34 g/dL — ABNORMAL HIGH (ref 32.0–36.0)
Lab: 6.9 10*3/uL (ref 1.8–7.0)
Lab: 73 % — ABNORMAL HIGH (ref 41–77)
Lab: 8.2 FL (ref 7–11)
Lab: 9.5 K/UL (ref 4.5–11.0)
Lab: 97 FL — ABNORMAL HIGH (ref 80–100)

## 2018-09-06 LAB — FOLATE, SERUM: Lab: 17 ng/mL (ref >3.9)

## 2018-09-06 LAB — VITAMIN B12: Lab: 614 pg/mL (ref 180–914)

## 2018-09-06 LAB — COMPREHENSIVE METABOLIC PANEL
Lab: 138 MMOL/L (ref 137–147)
Lab: 3.8 MMOL/L (ref 3.5–5.1)
Lab: 77 mg/dL (ref 70–100)

## 2018-09-06 NOTE — Progress Notes
GI staff attestation    ATTESTATION  Pt was seen on rounds on 09/05/18 with the GI fellow/  I personally performed the key portions of the E/M visit, discussed case with resident and concur with resident documentation of history, physical exam, assessment, and treatment plan unless otherwise noted.    Staff name:  Threasa Alpha, MD Date:  09/06/2018

## 2018-09-06 NOTE — Progress Notes
Alexis Weeks discharged on 09/06/2018.  Equipment Removed: Telepack.  Discharge instructions reviewed with patient.  Valuables returned:   Personal Items / Valuables: Dentures, Cell Phone, Clothing, Valuables/Belongings home with patient.  Home medications: N/a   .  Functional assessment at discharge complete: Yes .

## 2018-09-06 NOTE — Discharge Instructions - Appointments
Follow up with your PCP within 1-2 weeks. [facility] will call you to confirm details of your follow-up appointment. If you do not receive a telephone call within 2 days after discharge, please call [phone] to confirm details of your appointment.

## 2018-09-06 NOTE — Progress Notes
OCCUPATIONAL THERAPY  ASSESSMENT/DISCHARGE NOTE      Name: Alexis Weeks        MRN: [id number]          DOB: 1951-01-20          Age: 68 y.o.  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 Admitted from ED for management of pancreatitisith abdominal   Precautions: Falls  Pain / Complaints: Patient agrees to participate in therapy;Patient has no c/o pain    Objective  Psychosocial Status: Willing and Cooperative to Participate    Home Living  Type of Home: Apartment  Home Layout: One Level;Elevator  Financial risk analyst / Tub: Cabin crew: Academic librarian Accessibility: Accessible via [employer] Equipment: Gilmer Mor;Shower/Tub Bench;Other (Comment)(rollator)    Prior Function  Level Of Independence: Independent with ADLs and functional transfers;Needed assistance with homemaking  Lives With: Alone  Receives Help From: Patient Care Assistant  Homemaking Tasks: Meal Prep;Laundry;Cleaning;Driving;Shopping  Homemaking Assist: Moderate Assist  Other Function Comments: Pt with home health worker to assist with all IADLs. Comes to apartment daily to assist pt.     ADL's  Where Assessed: Chair  LE Dressing Assist: Independent  LE Dressing Deficits: No Assist Needed;Don/Doff R Sock;Don/Doff L Sock    ADL Mobility  Transfer Type: Sit to stand  Transfer: Assistance Level: From;Bedside chair;Modified independent  Transfer: Assistive Device: 4-wheeled walker  End of Activity Status: Nursing notified(sitting on rollator in door way)  Gait Distance: 360 feet  Gait: Assistance Level: Modified independent  Gait: Assistive Device: 4-wheeled walker Activity Tolerance  Endurance: 3/5 Tolerates 25-30 Minutes Exercise w/Multiple Rests    Cognition  Overall Cognitive Status: WFL to Adequately Complete Self Care Tasks Safely    Education  Persons Educated: Patient  Barriers To Learning: None Noted  Teaching Methods: Verbal Instruction  Patient Response: Verbalized Understanding  Topics: Role of OT, Goals for Therapy  Goal Formulation: With Patient    Assessment  Assessment: Decreased High-Level ADLs  Prognosis: Good;w/Home Health Nursing/ Aide  Goal Formulation: Patient  Comments: Pt currenlty at baseline function and not acute therapy goals are identified. OT will sign off at this time, please reconsulf if change in status occurs.     AM-PAC 6 Clicks Daily Activity Inpatient  Putting on and taking off regular lower body clothes?: None  Bathing (Including washing, rinsing, drying): None  Toileting, which includes using toilet, bedpan, or urinal: None  Putting on and taking off regular upper body clothing: None  Taking care of personal grooming such as brushing teeth: None  Eating meals?: None  Daily Activity Raw Score: 24  Standardized (t-scale) score: 57.54  CMS 0-100% Score: 0  CMS G Code Modifier: CH    Plan  Progress: Discontinue OT  OT Frequency: No Further Treatment    OT Discharge Recommendations  Recommendation: Home/prior living situation  Patient Currently Requires Equipment: Owns what is needed    Therapist: Dineen Kid, OTR/L [id number]  Date: 09/06/2018

## 2018-09-06 NOTE — Patient Instructions
Understanding Pancreatitis    If your pancreas suddenly becomes irritated or inflamed, you have acute pancreatitis. Acute pancreatitis is often very painful. Emergency medical treatment is usually needed. Chronic pancreatitis is a condition where your pancreas remains inflamed. It can lead to pain and other complications.???   Symptoms of acute pancreatitis  Symptoms include:  ??? Severe pain in your upper belly radiating???to your back  ??? Nausea and vomiting  ??? Belly swelling and tenderness  ??? Fever  ??? Rapid pulse  ??? Shallow, fast breathing  Treating acute pancreatitis  If you have acute pancreatitis, you may be in the hospital for a few days. For part of this time, you likely won???t be allowed to eat or drink. This lets your pancreas rest and heal. If your pancreatitis is severe, you may get nutrition and fluids through a feeding tube inserted into your belly. Medicines are given to help ease any pain.   Causes of pancreatitis  Gallstones are one of the most common causes of pancreatitis. These hard stones form in the gallbladder, an organ located near the pancreas. These 2 organs share a passage into the small intestine called the common bile duct. Fluid can't leave the pancreas, though,???if gallstones block this duct. The fluid backs up and causes pancreatitis. Alcohol is also an very common cause of pancreatitis. Certain medicines, trauma, and infection can also cause pancreatitis. Problems with the structure of the pancreas may also be a cause. There are also genetic problems that can cause pancreatitis.???   If you have chronic pancreatitis  If the pancreas stays inflamed for a long time, chronic pancreatitis may result. Common symptoms include diarrhea, weight loss, and belly pain. Possible complications of chronic pancreatitis include the following:   ??? Diabetes  ??? Not absorbing enough nutrients (malnutrition)  ??? Pancreatic cancer (rare)  ??? Chronic diarrhea  ??? Chronic pain Treatment for chronic pancreatitis includes the following:  ??? Medicines to help the pancreas work (enzymes) and to manage pain  ??? Dietary changes  ??? Stop smoking  ??? Treatment for gallstones  ??? Don't drink alcohol. The most important thing you can do is to avoid alcohol and smoking to help manage this disease.???  StayWell last reviewed this educational content on 08/13/2017  ??? 2000-2020 [employer], [place]. [address] [place], [place] [address]. All rights reserved. This information is not intended as a substitute for professional medical care. Always follow your healthcare professional's instructions.        Pancreatitis  The pancreas is an organ in the???abdomen that secretes digestive juices into the stomach. Pancreatitis is an inflammation of the pancreas. In many cases, it's caused when the duct that connects the pancreas and gallbladder is blocked by a gallstone.???Heavy alcohol use is another major cause.???Less common causes can include medicines, trauma, certain medical procedures, viruses, and toxins. Sometimes the cause of pancreatitis can't be found.???Genetic testing is sometimes done in those cases, especially if there is a family history of pancreas disease.   Symptoms of pancreatitis include:  ??? Severe abdominal pain???  ??? Nausea and vomiting  ??? Severe indigestion  ??? Racing heart  ??? Fever  If the pancreatitis becomes chronic, diarrhea, chronic pain, weight loss, and poor nutrition can result.   At first, pancreatitis may be treated in the hospital.???It may be diagnosed by history, exam, blood tests, and sometimes imaging studies.???There, fluids and medicines can be provided. The underlying cause of the problem must also be treated to prevent further problems. If gallstones  are the cause, you and your healthcare provider can discuss options for treating them.???This usually results in gallbladder surgery. Sometimes another test must be done to clear the drainage ducts of a blocked gallstones.??????If alcohol is the cause, talk with your healthcare provider about a program to help you stop drinking.   Home care  ??? Don't drink alcohol.  ??? Rest in bed or sit up in a chair until you feel better.  ??? Take medicines as prescribed. If you were given???an antibiotic for infection,???take it until it's gone, even if you feel better. Let your healthcare provider know if you vomit up your medicine.  Tips for eating and drinking:  ??? If instructed, don't eat or drink until nausea and vomiting go away.  ??? Try sipping clear liquids to prevent dehydration.???  ??? When you begin eating again, start with small amounts. Have small, more frequent meals rather than larger meals.???Low fat meals are best.  Follow-up care  Follow up with your healthcare provider as advised.   When to seek medical advice  Call your healthcare provider right away for any of the following:   ??? Continued or worsening pain  ??? Repeated vomiting  ??? Dizziness, weakness  ??? Fever of 100.4??? F (38??? C) or higher, or as directed by your healthcare provider  ??? Severe muscle cramps  Call 911  Call 911 if you have any of the following:   ??? Vomiting blood or large amounts of blood in stool  ??? Seizure  ??? Loss of consciousness  StayWell last reviewed this educational content on 10/13/2017  ??? 2000-2020 [employer], [place]. [address] [place], [place] [address]. All rights reserved. This information is not intended as a substitute for professional medical care. Always follow your healthcare professional's instructions.        Pancrelipase capsules  Brand Names: Creon, Pancrease, Pancreaze, Pertzye, Ultrase MT, Zenpep  What is this medicine?  PANCRELIPASE (pan cre LI pase) helps to improve digestion of food by replacing digestive enzymes. This medicine is used to treat health conditions that cause your body to produce less of these enzymes.  How should I use this medicine?  Take this medicine by mouth with a glass of water. Follow the directions on the prescription label. Take with food. Do not crush or chew the contents of the capsule. If you or your child have trouble swallowing, you may open the capsule and sprinkle the contents on soft foods that do not require chewing such as applesauce, pureed bananas, or pears. Swallow the mixture right away followed with water or juice. Do not store the mixture. If you are giving this medicine to an infant, you may sprinkle the contents directly into your child's mouth. Give the medicine right before each feeding of formula or breast milk. Do not mix capsule contents directly into formula or breast milk. Make sure the medicine is swallowed completely and that no medicine is left in the mouth. Take your doses at regular intervals. Do not take your medicine more often than directed.  A special MedGuide will be given to you by the pharmacist with each prescription and refill of delayed-release capsules (Creon, Zenpep, or Pancreaze). Be sure to read this information carefully each time.  Talk to your pediatrician regarding the use of this medicine in children. While this medicine may be prescribed for children for selected conditions precautions do apply.  What side effects may I notice from receiving this medicine?  Side effects that you should report  to your doctor or health care professional as soon as possible:  ??? allergic reactions like skin rash, itching or hives, swelling of the face, lips, or tongue  ??? fever or chills, sore throat  ??? severe stomach pain or bloating  ??? shortness of breath  ??? skin rash  ??? trouble passing stool  ??? vomiting  Side effects that usually do not require medical attention (report to your doctor or health care professional if they continue or are bothersome):  ??? constipation or diarrhea  ??? cough  ??? dizziness  ??? headache  ??? nausea  ??? stomach gas  ??? stomach pain  ??? weight loss  What may interact with this medicine?  ??? acarbose  ??? antacids containing calcium or magnesium  ??? iron  ??? miglitol What if I miss a dose?  If you miss a dose, take it as soon as you can. If it is almost time for your next dose, take only that dose. Do not take double or extra doses.  Where should I keep my medicine?  Keep out of the reach of children.  Store at room temperature between 15 and 25 degrees C (59 and 77 degrees F). Do not refrigerate. Protect from moisture. Throw away any unused medicine after the expiration date.  What should I tell my health care provider before I take this medicine?  They need to know if you have any of these conditions:  ??? a history of intestinal blockage or a condition called 'fibrosing colonopathy'  ??? abnormally high uric acid in the blood  ??? diabetes  ??? gout  ??? kidney disease  ??? trouble swallowing capsules  ??? an unusual or allergic reaction to pancrelipase, pancreatin, pork, pork protein, other medicines, foods, dyes, or preservatives  ??? pregnant or trying to get pregnant  ??? breast-feeding  What should I watch for while using this medicine?  Visit your doctor for regular check ups. Talk to your doctor before you change brands of this medicine. Each brand has different amounts of enzymes.  You may need to be on a special diet while taking this medicine. Also, ask your doctor how much water you need to drink.  This medicine may increase your chance of having a rare bowel disorder. The risk of having this condition may be reduced by following the dosing directions that your healthcare professional gives you. Call your healthcare professional right away if you have any unusual or severe stomach pain.  This medicine may increase blood uric acid levels, for example, worsening of gout, or painful, swollen joints. Call your healthcare professional right away if you have any of these symptoms.  Be careful if you open the capsule. This medicine can irritate the lungs if you breathe it in. Also, do not hold the medicine in your mouth or chew it. This may cause mouth sores. This medicine may affect blood sugar levels. If you have diabetes, check with your doctor or health care professional before you change your diet or the dose of your diabetic medicine.  Women should inform their doctor if they wish to become pregnant or think they might be pregnant.  NOTE:This sheet is a summary. It may not cover all possible information. If you have questions about this medicine, talk to your doctor, pharmacist, or health care provider. Copyright??? 2020 [employer]        How to Quit Smoking  Smoking is a hard habit to break. About 50% of all???people who have ever smoked have been able  to quit. Most people???who still smoke want to quit. Here are some of the best ways to stop smoking.     Keep in mind the health benefits of quitting  The health benefits of quitting start right away. They keep improving the longer you go without smoking. Knowing this can help inspire you to stay on track. These benefits occur at any age. If you are 17 or 70, quitting is a good choice. Some of the health benefits after your last cigarette include:   ??? After 20 minutes: Your blood pressure and pulse return to normal.  ??? After 8 hours: Your oxygen levels return to normal.  ??? After 2 days: Your ability to smell and taste start to improve as damaged nerves regrow.  ??? After 2 to 3 weeks: Your circulation and lung function improve.  ??? After 1 to 9 months: Your coughing, congestion, and shortness of breath decrease. Your tiredness decreases.  ??? After 1 year: Your risk of heart attack decreases by 50%.  ??? After 5 years: Your risk of lung cancer decreases by 50%. Your risk of stroke becomes the same as a nonsmoker???s.  Go cold [place]  Most???former smokers quit cold [place]. This means stopping all at once. Trying to cut back slowly often doesn't work as well. This may be because it continues the habit of smoking. Also you may inhale more smoke while smoking fewer cigarettes. This leads to the same amount of nicotine in your body. Get support  Support programs can be a big help, especially for heavy smokers. These groups offer lectures, ways to change behavior, and peer support. Here are some ways to find a support program:   ??? Free national quitline 800-QUIT-NOW 7812896154)  ??? Hospital quit-smoking programs  ??? American Lung Association 928 702 0951  ??? American Cancer Society 267-271-9884  Support at home is important too. Family and friends can offer praise and reassurance. If the smoker in your life finds it hard to quit, encourage them to keep trying.   Try over-the-counter medicine  Nicotine replacement therapy???may make it???easier to quit. Some aids are available without a prescription. These include a nicotine patch, gum, and lozenges. But it's best to use these under the care of your healthcare provider. The skin patch gives a steady supply of nicotine. Nicotine gum and lozenges give???short-time doses of low levels of nicotine. Both methods reduce the craving for cigarettes. If you???have nausea, vomiting, dizziness, weakness, or a fast heartbeat, stop using these products. See your provider.   Ask about prescription medicine  After reviewing???your smoking patterns and past attempts to quit, your healthcare provider may offer a prescription medicine such as bupropion, varenicline, a nicotine inhaler, or nasal spray. Each has advantages and side effects. Your provider can review these with you.   Keep trying  Most smokers make many attempts at quitting before they succeed. It???s important not to give up.   To learn more  For more on how to quit smoking, try these resources:???  ??? http://rich[place].com/ 800-QUIT-NOW 908-495-9291)  ??? www.smokefree.gov 877-44U-QUIT 845 557 3878)  ??? [employer] 800-LUNGUSA (770)274-2241)  StayWell last reviewed this educational content on 02/12/2018  ??? 2000-2020 [employer], [place]. [address] [place], [place] [address]. All rights reserved. This information is not intended as a substitute for professional medical care. Always follow your healthcare professional's instructions.

## 2018-09-06 NOTE — Progress Notes
PHYSICAL THERAPY  NOTE      Name: Alexis Weeks        MRN: [id number]          DOB: 03-Mar-1951          Age: 68 y.o.  Admission Date: 09/01/2018             LOS: 4 days      Per discussion with OT, patient reports no concerns with mobility with no PT goals identified. PT will discontinue service, please re-consult if the patient has a decline in functional status.     Therapist: Lavonda Jumbo, PT, DPT [phone]   Date: 09/06/2018

## 2018-09-07 ENCOUNTER — Encounter: Admit: 2018-09-07 | Discharge: 2018-09-07

## 2018-09-07 DIAGNOSIS — K861 Other chronic pancreatitis: Secondary | ICD-10-CM

## 2018-09-07 DIAGNOSIS — Z1159 Encounter for screening for other viral diseases: Secondary | ICD-10-CM

## 2018-09-07 MED ORDER — SODIUM CHLORIDE 0.9 % IV SOLP
INTRAVENOUS | 0 refills | Status: CN
Start: 2018-09-07 — End: ?

## 2018-09-07 NOTE — Discharge Instructions - Pharmacy
Physician Discharge Summary      Name: Alexis Weeks  Medical Record Number: [id number]        Account Number:  [id number]  Date Of Birth:  1951/02/24                         Age:  68 years   Admit date:  09/01/2018                     Discharge date:  09/06/2018    Attending Physician:  Dr. Tonita Phoenix               Service: Family Medicine    Physician Summary completed by: Arlis Porta, MD     Reason for hospitalization: pancreatitis, hematemasis    Significant PMH:   Medical History:   Diagnosis Date   ??? A-fib (HCC)    ??? Chest pain     with activity   ??? COPD (chronic obstructive pulmonary disease) (HCC)    ??? Hepatitis     C, treated with pills    ??? History of GI bleed 12/01/2017    At [facility] in June, [facility].  Upper GI endoscopy did not identify a cause   ??? Hypertension        Allergies: Penicillins    Admission Physical Exam notable for:    Constitutional:       Appearance: He is well-developed and normal weight. He is not diaphoretic.   HENT:      Head: Normocephalic.      Mouth/Throat:      Pharynx: Oropharynx is clear.   Eyes:      Conjunctiva/sclera: Conjunctivae normal.   Neck:      Musculoskeletal: Normal range of motion.   Cardiovascular:      Rate and Rhythm: Tachycardia present. Rhythm irregular.   Pulmonary:      Effort: Pulmonary effort is normal. No respiratory distress.   Abdominal:      General: Bowel sounds are normal.      Palpations: There is no shifting dullness or fluid wave.      Tenderness: There is generalized abdominal tenderness and tenderness in the epigastric area. There is no guarding.   Musculoskeletal: Normal range of motion.   Skin:     General: Skin is warm and dry.      Findings: No rash.   Neurological:      Mental Status: He is alert and oriented to person, place, and time.   Psychiatric:         Mood and Affect: Mood normal.         Behavior: Behavior normal.         Thought Content: Thought content normal.         Judgment: Judgment normal. Admission Lab/Radiology studies notable for:   CBC AND DIFF   ??? Collection Time: 09/01/18 10:35 PM   Result Value Ref Range   ??? White Blood Cells 11.0 4.5 - 11.0 K/UL   ??? RBC 3.39 (L) 4.4 - 5.5 M/UL   ??? Hemoglobin 11.6 (L) 13.5 - 16.5 GM/DL   ??? Hematocrit 33.4 (L) 40 - 50 %   ??? MCV 98.5 80 - 100 FL   ??? MCH 34.2 (H) 26 - 34 PG   ??? MCHC 34.7 32.0 - 36.0 G/DL   ??? RDW 15.8 (H) 11 - 15 %   ??? Platelet Count 160 150 - 400 K/UL   ???  MPV 8.5 7 - 11 FL   ??? Neutrophils 80 (H) 41 - 77 %   ??? Lymphocytes 11 (L) 24 - 44 %   ??? Monocytes 9 4 - 12 %   ??? Eosinophils 0 0 - 5 %   ??? Basophils 0 0 - 2 %   ??? Absolute Neutrophil Count 8.71 (H) 1.8 - 7.0 K/UL   ??? Absolute Lymph Count 1.20 1.0 - 4.8 K/UL   ??? Absolute Monocyte Count 1.01 (H) 0 - 0.80 K/UL   ??? Absolute Eosinophil Count 0.02 0 - 0.45 K/UL   ??? Absolute Basophil Count 0.04 0 - 0.20 K/UL   COMPREHENSIVE METABOLIC PANEL   ??? Collection Time: 09/01/18 10:35 PM   Result Value Ref Range   ??? Sodium 139 137 - 147 MMOL/L   ??? Potassium 3.6 3.5 - 5.1 MMOL/L   ??? Chloride 105 98 - 110 MMOL/L   ??? Glucose 110 (H) 70 - 100 MG/DL   ??? Blood Urea Nitrogen 16 7 - 25 MG/DL   ??? Creatinine 0.87 0.4 - 1.24 MG/DL   ??? Calcium 9.5 8.5 - 10.6 MG/DL   ??? Total Protein 7.6 6.0 - 8.0 G/DL   ??? Total Bilirubin 0.8 0.3 - 1.2 MG/DL   ??? Albumin 4.2 3.5 - 5.0 G/DL   ??? Alk Phosphatase 114 (H) 25 - 110 U/L   ??? AST (SGOT) 14 7 - 40 U/L   ??? CO2 23 21 - 30 MMOL/L   ??? ALT (SGPT) 10 7 - 56 U/L   ??? Anion Gap 11 3 - 12   ??? eGFR Non African American >60 >60 mL/min   ??? eGFR African American >60 >60 mL/min   LIPASE   ??? Collection Time: 09/01/18 10:35 PM   Result Value Ref Range   ??? Lipase 703 (H) 11 - 82 U/L   ALCOHOL LEVEL   ??? Collection Time: 09/01/18 10:35 PM   Result Value Ref Range   ??? Alcohol <10 MG/DL   BLOOD BANK SAMPLE HOLD   ??? Collection Time: 09/01/18 10:35 PM   Result Value Ref Range   ??? BB Sample hold IN LAB ???   POC TROPONIN   ??? Collection Time: 09/01/18 10:39 PM   Result Value Ref Range ??? Troponin-I-POC 0.02 0.00 - 0.05 NG/ML   POC LACTATE   ??? Collection Time: 09/01/18 10:41 PM   Result Value Ref Range   ??? LACTIC ACID POC 0.8 0.5 - 2.0 MMOL/L   URINALYSIS DIPSTICK   ??? Collection Time: 09/02/18  1:56 AM   Result Value Ref Range   ??? Color,UA YELLOW ???   ??? Turbidity,UA CLEAR CLEAR-CLEAR   ??? Specific Gravity-Urine 1.015 1.003 - 1.035   ??? pH,UA 6.0 5.0 - 8.0   ??? Protein,UA 1+ (A) NEG-NEG   ??? Glucose,UA NEG NEG-NEG   ??? Ketones,UA TRACE (A) NEG-NEG   ??? Bilirubin,UA NEG NEG-NEG   ??? Blood,UA 2+ (A) NEG-NEG   ??? Urobilinogen,UA NORMAL NORM-NORMAL   ??? Nitrite,UA NEG NEG-NEG   ??? Leukocytes,UA NEG NEG-NEG   ??? Urine Ascorbic Acid, UA POS (A) NEG-NEG   URINALYSIS, MICROSCOPIC   ??? Collection Time: 09/02/18  1:56 AM   Result Value Ref Range   ??? WBCs,UA 0-2 0 - 2 /HPF   ??? RBCs,UA 2-10 0 - 3 /HPF   ??? MucousUA TRACE ???   PROTIME INR (PT)   ??? Collection Time: 09/02/18  4:47 AM   Result Value Ref Range   ???  INR 1.3 (H) 0.8 - 1.2   ???  Glucose: (!) 110 (09/01/18 2235)  Pertinent radiology reviewed., EKG Reviewed   No orders to display   ???  6/15 CTA Chest/Abd/Pelv  IMPRESSION      CHEST:  1. Normal caliber thoracic aorta without evidence of dissection or   intramural hematoma.  2. Improvement in impacted posterior left upper lobe bronchus and   improvement in surrounding groundglass opacities. Additional follow-up CT   chest in 3-6 months is recommended for reevaluation.  3. Mild emphysema. Additional tiny nodules are unchanged.  4. Mild cardiomegaly and calcific coronary artery disease. ???    ABDOMEN AND PELVIS:  1. 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 No peripancreatic fluid collection. 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  2. No aortoiliac dissection. 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 Wide patency of the major visceral arteries.  3. Trace pelvic ascites, likely reactive. No bowel obstruction or   pneumoperitoneum.  4. Diverticulosis without evidence of diverticulitis.  5. Mildly nodular liver with fissural widening suggestive of early   cirrhosis.       Brief Hospital Course:  The patient was admitted and the following issues were addressed during this hospitalization: (with pertinent details).      Patient had an unremarkable hospital course.  He came to the ED due to abdominal pain and reporting hematemesis.  CT and lipase consistent with pancreatitis.  Prior to this admission patient was seen in our ED on  6/15 with similar complaints and left AMA from the ED.  Patient agreed to admission on this visit.  He was treated with IV LR at 250 mL/h and fentanyl PCA for pain control.  As his pain improved, he was able to start a diet, and he was switched to his home pain control regimen of oxycodone 20 mg every 6 hours.  IVF was stopped, a day of discharge patient says that  his abdominal pain is well controlled on this pain regimen, he is eating a regular diet, and feels he is ready to go home.  Of note, patient was not on a fentanyl patch which she has prescribed as an outpatient, in addition he was only using oxycodone 20 mg twice a day although it is prescribed for 4 times a day as an outpatient.    On discharge patient requested a week's worth of his oxycodone prescription for his back pain due to being out of the medication and not being able to get into his PCP.  On review of PMP, patient did fill a prescription for this medication 3 days prior to being admitted in addition to recently filling fentanyl patch prescription.    GI was consulted for evaluation of his hematemesis.  However, there were no episodes of hematemesis or hemoptysis during this hospitalization.  His hemoglobin was stable throughout the hospitalization.  It was not necessary to repeat an EGD during this admission.  He did recently have an EGD 6 months ago with similar complaints that was unremarkable.    Pancreatic lesions were noted on his CT abdomen, and he will follow-up with GI as an outpatient.  Planning for EUS in 4 weeks.    During hospitalization, patient's chronic conditions of hypertension, A. fib, COPD, nicotine dependence were managed without issue. PT/OT evaluated the patient and have no concerns for  him discharging to home.    Condition at Discharge: Stable    Discharge Diagnoses:     Hospital Problems        Active Problems    Essential hypertension    Chronic atrial fibrillation    COPD (chronic obstructive pulmonary disease) (HCC)       Resolved Problems    * (Principal) RESOLVED: Hematemesis    RESOLVED: Hemoptysis          Surgical Procedures: None    Significant Diagnostic Studies and Procedures: noted in brief hospital course    Consults:  GI    Patient Disposition: Home       Patient instructions/medications:       Low Fat / Low Cholesterol Diet    Your goal is to limit the amount of saturated and trans fats in your diet. Keep track of how much cholesterol you eat and limit the total amount to 200mg  (milligrams) a day.      If you have questions about your diet after you go home, you can call a dietitian at [phone].       Report These Signs and Symptoms    Please contact your doctor if you have any of the following symptoms: temperature higher than 100.4 degrees F, uncontrolled pain, persistent nausea and/or vomiting, difficulty breathing, chest pain, severe abdominal pain, headache, unable to urinate, unable to have bowel movement or drainage with a foul odor     Questions About Your Stay    For questions or concerns regarding your hospital stay, call [phone].     Discharging attending physician: Arlis Porta [[id number]]      Activity as Tolerated    It is important to keep increasing your activity level after you leave the hospital.  Moving around can help prevent blood clots, lung infection (pneumonia) and other problems.  Gradually increasing the number of times you are up moving around will help you return to your normal activity level more quickly.  Continue to increase the number of times you are up to the chair and walking daily to return to your normal activity level. Begin to work toward your normal activity level at discharge      Current Discharge Medication List       CONTINUE these medications which have NOT CHANGED    Details   albuterol (PROAIR HFA, VENTOLIN HFA, OR PROVENTIL HFA) 90 mcg/actuation inhaler Inhale 1-2 puffs by mouth into the lungs every 6 hours as needed for Wheezing or Shortness of Breath. Shake well before use.    PRESCRIPTION TYPE:  Historical Med      aspirin 81 mg chewable tablet Chew one tablet by mouth daily. Take with food.  Qty: 90 tablet, Refills: 3    PRESCRIPTION TYPE:  Normal  Associated Diagnoses: Atrial fibrillation, unspecified type (HCC); Presence of Watchman left atrial appendage closure device      azithromycin (ZITHROMAX) 250 mg tablet Take two tablets by mouth three times weekly.  Qty: 156 tablet, Refills: 1    PRESCRIPTION TYPE:  Normal      cholecalciferol (VITAMIN D3) 2,000 unit tablet Take 2,000 Units by mouth daily.    PRESCRIPTION TYPE:  Historical Med      cyclobenzaprine (FLEXERIL) 10 mg tablet Take 10 mg by mouth three times daily as needed for Muscle Cramps.    PRESCRIPTION TYPE:  Historical Med      diltiazem CD (CARDIZEM CD) 240 mg capsule Take one capsule by mouth daily.  Qty: 90  capsule, Refills: 3    PRESCRIPTION TYPE:  Normal      docusate (COLACE) 100 mg capsule Take 100 mg by mouth daily as needed for Constipation.    PRESCRIPTION TYPE:  Historical Med      fentaNYL (DURAGESIC) 25 mcg/hr patch Apply 1 patch to top of skin as directed every 72 hours    PRESCRIPTION TYPE:  Historical Med      gabapentin (NEURONTIN) 300 mg capsule Take 300 mg by mouth every 8 hours as needed.    PRESCRIPTION TYPE:  Historical Med      lisinopriL (ZESTRIL) 10 mg tablet Take 10 mg by mouth daily.    PRESCRIPTION TYPE:  Historical Med      metoprolol tartrate (LOPRESSOR) 100 mg tablet Take 100 mg by mouth twice daily.    PRESCRIPTION TYPE:  Historical Med      oxybutynin chloride (DITROPAN) 5 mg tablet Take one tablet by mouth three times daily as needed.  Qty: 30 tablet, Refills: 3    PRESCRIPTION TYPE:  Print      oxycodone (ROXICODONE) 20 mg tablet Take 20 mg by mouth every 6 hours as needed      PRESCRIPTION TYPE:  Historical Med      pancrelipase (CREON 24,000) 24,000-76,000 -120,000 units capsule Take three capsules by mouth three times daily with meals. Take 2 tabs by mouth twice daily with snacks.  Qty: 390 capsule, Refills: 5    PRESCRIPTION TYPE:  Normal      umeclidinium-vilanterol (ANORO ELLIPTA) 62.5-25 mcg/actuation inhaler Inhale one puff by mouth into the lungs daily.  Qty: 1 each, Refills: 11    PRESCRIPTION TYPE:  Normal          The following medications were removed from your list. This list includes medications discontinued this stay and those removed from your prior med list in our system        duloxetine DR (CYMBALTA) 60 mg capsule        phenazopyridine (PYRIDIUM) 100 mg tablet            Scheduled appointments:    Dec 27, 2018  1:00 PM CDT  CT CHEST W/O CONTRAST with CT-MOB  [facility] Radiology) [address]  [address]  [place] [address]  [phone]   Dec 27, 2018  3:30 PM CDT  Return Patient with Valentino Nose, MD  [facility] (Internal Medicine) [address]  [place] [address]  [phone]   Feb 20, 2019 10:00 AM CST  (Arrive by 9:45 AM)  Procedure-40 with CC PROCEDURE ROOM 1  [facility] Exam) [facility]  [address] [place] [address]  [phone]   Feb 20, 2019 10:20 AM CST  (Arrive by 10:05 AM) Return Patient with Barbra Sarks, MD  [facility] Exam) [facility]  [address] [place] [address]  [phone]    Additional appointment instructions:      Follow up with your PCP within 1-2 weeks. [facility] will call you to confirm details of your follow-up appointment. If you do not receive a telephone call within 2 days after discharge, please call [phone] to confirm details of your appointment.                    Pending items needing follow up: encourage alcohol  cessation to prevent pancreatitis recurrence.  Follow-up with GI, EUS in 4 weeks for eval of pancreatic lesions.    Signed:  Arlis Porta, MD  09/06/2018      cc:  Primary Care Physician:  Precious Reel   Verified  Referring physicians:  No ref. provider found   Additional provider(s):

## 2018-09-07 NOTE — Telephone Encounter
-----   Message from Marrianne Mood, MD sent at 09/05/2018  8:49 PM CDT -----  Please schedule him for EUS with Dr. Candi Leash in 4 weeks.  Thank you

## 2018-09-07 NOTE — Telephone Encounter
Order placed.

## 2018-09-19 ENCOUNTER — Encounter: Admit: 2018-09-19 | Discharge: 2018-09-19

## 2018-09-19 NOTE — Telephone Encounter
Patient's Caregiver, Roselyn Reef LVM requesting copy of DC Instructions when last in hospital.     RN lvm requesting DOB or other identifying information for patient.     Jamie LVM with DOB and spelling of patient's name.     RN cut/ pasted DC information on letterhead. LVM that information will be mailed to patient's home.

## 2018-09-22 ENCOUNTER — Encounter: Admit: 2018-09-22 | Discharge: 2018-09-22

## 2018-10-13 DIAGNOSIS — K861 Other chronic pancreatitis: Secondary | ICD-10-CM

## 2018-10-13 DIAGNOSIS — K859 Acute pancreatitis without necrosis or infection, unspecified: Principal | ICD-10-CM

## 2018-10-16 NOTE — Progress Notes
Called to prescreen for his Upper Endoscopy with Ultrasound - spoke to patient. Pt reports he is hurting and needs to be scheduled ASAP. Called M. Powell in Endoscopy and gained permission to add him on for This coming Friday. Called Mr. Delman back and scheduled him  on Friday, August 7 at 1130 to be in admissions at 1000. Prep reviewed over the phone until they where able to repeat back the instructions.     COVID 19 testing was set up for Wednesday, August 5. Teaching completed on this visit.        EUS (ENDOSCOPIC ULTRASOUND) PREP      An endoscopic ultrasound (EUS) is a test to look at the inside of your gastrointestinal (GI) tract. It is commonly used to look for cancers or growths in the esophagus, stomach, pancreas, liver, and rectum. It can help to stage cancer (see how advanced a cancer is). EUS may also be used to help diagnose certain diseases or to drain cysts or abscesses.    5 Days Prior:  1. Check with your prescribing physician for instructions about stopping your blood thinner.  Examples of blood thinners are Aleve, Aspirin. Coumadin, Eliquis, Ibuprofen, Naproxen, Plavix, and Xarelto.  2. Do not give yourself a Lovenox injection the morning of the test. Lovenox injections may be taken as usual through the day before your test.   3. Discuss diabetic medications and insulin with the prescribing physician.    The Day Of Your Exam:  1. Do not eat or drink anything after midnight the night before your exam.  This includes GUM or CANDY.  a. Chewing tobacco must be stopped (6) hours before your scheduled procedure.  b. If you have an early morning test, take ONLY your essential morning medications (heart, blood pressure, seizure, etc.) with a small sip of water.   2. You will be sedated for the procedure. A responsible adult must drive you home (no Benedetto Goad, taxis or buses are allowed). If you do not have a driver we will be unable to do the test.  3. You will be here for (3-4) hours from arrival time. 4. You will not be able to return to work the same day if you have received sedation.   5. Please bring a list of your current medications and the dosages with you.     The Procedure:  ? You lie on your left side on an exam table.   ? You will be monitored and given oxygen.   ? Your throat may be numbed with a spray or gargle. An intravenous (IV) line will be put into a vein in your arm or hand. This line supplies fluids and medicines. To keep you comfortable during the test, you will be given a sedative medicine. This medicine prevents discomfort and will make you sleepy.  ? During the test, the scope sends live video and ultrasound images from inside your body to nearby monitors. These are used to examine your GI tract. Specialized procedures, such as drainage, are done as needed.  ? The healthcare provider may discuss the results with you soon after the test. Biopsy results take about (10) business days.  ? After the procedure is done, you rest for a short time period.   ? An adult must drive you home.

## 2018-10-18 ENCOUNTER — Ambulatory Visit: Admit: 2018-10-18 | Discharge: 2018-10-19

## 2018-10-18 DIAGNOSIS — Z01818 Encounter for other preprocedural examination: Secondary | ICD-10-CM

## 2018-10-18 NOTE — Progress Notes
Patient arrived to [facility] for COVID-19 testing 10/18/18 1213. Patient identity confirmed via photo I.D. Nasopharyngeal procedure explained to the patient.   Nasopharyngeal swab completed right  Patient education provided given and instructed patient self isolate until contacted w/ results and further instructions. CDC handout on COVID-19 given to patient.   [email or web address]    Swab collected by Haze Justin, RN.    Date symptoms began/reason for testing: pre op

## 2018-10-19 DIAGNOSIS — Z1159 Encounter for screening for other viral diseases: Secondary | ICD-10-CM

## 2018-10-19 LAB — COVID-19 (SARS-COV-2) PCR

## 2018-10-19 MED ORDER — METOCLOPRAMIDE HCL 5 MG/ML IJ SOLN
10 mg | Freq: Once | INTRAVENOUS | 0 refills | Status: CN | PRN
Start: 2018-10-19 — End: ?

## 2018-10-19 MED ORDER — FENTANYL CITRATE (PF) 50 MCG/ML IJ SOLN
50 ug | INTRAVENOUS | 0 refills | Status: CN | PRN
Start: 2018-10-19 — End: ?

## 2018-10-19 NOTE — Anesthesia Pre-Procedure Evaluation
Anesthesia Pre-Procedure Evaluation    Name: Alexis Weeks      MRN: [id number]     DOB: 15-Aug-1950     Age: 68 y.o.     Sex: male   __________________________________________________________________________     Procedure Date: 10/20/2018    Procedure: ???Procedure(s):  ESOPHAGOGASTRODUODENOSCOPY WITH ENDOSCOPIC ULTRASOUND EXAMINATION - FLEXIBLE      Physical Assessment  Vital Signs (last filed in past 24 hours):         Patient History  Allergies   Allergen Reactions   ??? Penicillins HIVES and EDEMA        Current Medications    Medication Directions   albuterol (PROAIR HFA, VENTOLIN HFA, OR PROVENTIL HFA) 90 mcg/actuation inhaler Inhale 1-2 puffs by mouth into the lungs every 6 hours as needed for Wheezing or Shortness of Breath. Shake well before use.   aspirin 81 mg chewable tablet Chew one tablet by mouth daily. Take with food.   azithromycin (ZITHROMAX) 250 mg tablet Take two tablets by mouth three times weekly.   cholecalciferol (VITAMIN D3) 2,000 unit tablet Take 2,000 Units by mouth daily.   cyclobenzaprine (FLEXERIL) 10 mg tablet Take 10 mg by mouth three times daily as needed for Muscle Cramps.   diltiazem CD (CARDIZEM CD) 240 mg capsule Take one capsule by mouth daily.  Patient taking differently: Take 240 mg by mouth daily with breakfast.   docusate (COLACE) 100 mg capsule Take 100 mg by mouth daily as needed for Constipation.   gabapentin (NEURONTIN) 300 mg capsule Take 300 mg by mouth every 8 hours as needed.   lisinopriL (ZESTRIL) 10 mg tablet Take 10 mg by mouth daily.   metoprolol tartrate (LOPRESSOR) 100 mg tablet Take 100 mg by mouth twice daily.   oxybutynin chloride (DITROPAN) 5 mg tablet Take one tablet by mouth three times daily as needed.   oxycodone (ROXICODONE) 20 mg tablet Take 20 mg by mouth every 6 hours as needed     pancrelipase (CREON 24,000) 24,000-76,000 -120,000 units capsule Take three capsules by mouth three times daily with meals. Take 2 tabs by mouth twice daily with snacks. Patient taking differently: Take 1 capsule by mouth twice daily with meals.   umeclidinium-vilanterol (ANORO ELLIPTA) 62.5-25 mcg/actuation inhaler Inhale one puff by mouth into the lungs daily.  Patient taking differently: Inhale 1 puff by mouth into the lungs twice daily.         Review of Systems/Medical History      Patient summary reviewed  Nursing notes reviewed  Pertinent labs reviewed    PONV Screening: Postoperative opioids  No history of anesthetic complications  No family history of anesthetic complications      Airway - negative        Ventilated by mask (1); Direct laryngoscopy, Stylet; Single-Lumen, Cuffed; 7.87mm; Mac; 3; Oral; 1-Full view of the glottis; 1 insertion attempt      Pulmonary       Current smoker (1/2 pack a day X 40 years); patient did not smoke on day of surgery        COPD (uses inhalers every day), severe       Cardiovascular       Recent diagnostic studies:          ECG and echocardiogram          03/27/18 -  TRANSESOPHAGEAL ECHOCARDIOGRAM  Normal biventricular function in the limited views obtained. LVEF of 55%  A Watchman device is visualized in the left atrial  appendage. There is flow present around the device, the maximum diameter is 2 mm.   There is mild to moderate biatrial enlargement.   Structurally normal mitral valve with mild to moderate mitral regurgitation  Agitated saline demonstrated a small early right to left interatrial shunt.  ???  Compared to prior study 10/20/17 a Watchman device is visualized in the left atrial appendage. Peri-device flow is present, the maximal diameter is 2 mm. There is a small right to left interatrial shunt by agitated saline contrast.      Exercise tolerance: >4 METS       Beta Blocker therapy: Yes      Beta blockers within 24 hours: Yes      Hypertension (on lisinopril and metoprolol),         Valvular problems/murmurs: moderate MR.        Dysrhythmias (s/p Watchman device); atrial fibrillation Dyspnea on exertion (Can walk 1 block before needing rest)      GI/Hepatic/Renal           Hepatitis (pt takes mavyret) C      Liver disease       Renal disease: CRI       Vomiting (occasionally, feels good today)      Abdominal pain March 2018 and imaging showed pancreatic cyst, but patient was in prison for the past year.       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 No rhonchi.      Comments:       Abdominal Findings: Negative      Neurological Findings:       Alert and oriented x 3    Constitutional findings:       No acute distress       Diagnostic Tests  Hematology:   Lab Results   Component Value Date    HGB 10.3 09/06/2018    HCT 29.6 09/06/2018    PLTCT 210 09/06/2018    WBC 9.5 09/06/2018    NEUT 73 09/06/2018    ANC 6.96 09/06/2018    ALC 1.53 09/06/2018    MONA 10 09/06/2018    AMC 0.94 09/06/2018    EOSA 0 09/06/2018    ABC 0.07 09/06/2018    MCV 97.1 09/06/2018    MCH 33.8 09/06/2018    MCHC 34.8 09/06/2018    MPV 8.2 09/06/2018    RDW 15.4 09/06/2018         General Chemistry:   Lab Results   Component Value Date    NA 138 09/06/2018    K 3.8 09/06/2018    CL 103 09/06/2018    CO2  22 09/06/2018    GAP 13 09/06/2018    BUN 21 09/06/2018    CR 1.14 09/06/2018    GLU 77 09/06/2018    CA 9.2 09/06/2018    ALBUMIN 3.8 09/06/2018    MG 2.0 12/21/2017    TOTBILI 1.5 09/06/2018    PO4 3.4 09/13/2017      Coagulation:   Lab Results   Component Value Date    PTT 29.4 04/20/2018    INR 1.3 09/02/2018 TEE 03/27/2018  ??? Normal biventricular function in the limited views obtained. LVEF of 55%  ??? A Watchman device is visualized in the left atrial appendage. There is flow present around the device, the maximum diameter is 2 mm.   ??? There is mild to moderate biatrial enlargement.   ??? Structurally normal mitral valve with mild to moderate mitral regurgitation  ??? Agitated saline demonstrated a small early right to left interatrial shunt.      Anesthesia Plan    ASA score: 3   Plan: MAC  Induction method: intravenous  NPO status: acceptable      Informed Consent  Anesthetic plan and risks discussed with patient.  Use of blood products discussed with patient  Blood Consent: consented      Plan discussed with: anesthesiologist and CRNA.  Comments: (Plan for GETA w/ RSI.  All questions answered.  Consent signed.)

## 2018-10-19 NOTE — Progress Notes
No answer, left VM 10/19/2018 at 1852.    Crissie Sickles, DNP, APRN, FNP-BC, CCRN, [place], CNRN

## 2018-10-20 ENCOUNTER — Encounter: Admit: 2018-10-20 | Discharge: 2018-10-20

## 2018-10-20 ENCOUNTER — Ambulatory Visit: Admit: 2018-10-20 | Discharge: 2018-10-20

## 2018-10-20 ENCOUNTER — Ambulatory Visit: Admit: 2018-10-19 | Discharge: 2018-10-19

## 2018-10-20 DIAGNOSIS — K8689 Other specified diseases of pancreas: Secondary | ICD-10-CM

## 2018-10-20 DIAGNOSIS — F17210 Nicotine dependence, cigarettes, uncomplicated: Secondary | ICD-10-CM

## 2018-10-20 DIAGNOSIS — K861 Other chronic pancreatitis: Secondary | ICD-10-CM

## 2018-10-20 MED ORDER — PROPOFOL 10 MG/ML IV EMUL 20 ML (INFUSION)(AM)(OR)
INTRAVENOUS | 0 refills | Status: DC
Start: 2018-10-20 — End: 2018-10-20
  Administered 2018-10-20: 18:00:00 120 ug/kg/min via INTRAVENOUS

## 2018-10-20 MED ORDER — LACTATED RINGERS IV SOLP
1000 mL | INTRAVENOUS | 0 refills | Status: DC
Start: 2018-10-20 — End: 2018-10-20
  Administered 2018-10-20: 17:00:00 1000 mL via INTRAVENOUS

## 2018-10-20 MED ORDER — PROPOFOL INJ 10 MG/ML IV VIAL
0 refills | Status: DC
Start: 2018-10-20 — End: 2018-10-20
  Administered 2018-10-20: 18:00:00 50 mg via INTRAVENOUS
  Administered 2018-10-20: 18:00:00 30 mg via INTRAVENOUS
  Administered 2018-10-20: 18:00:00 20 mg via INTRAVENOUS
  Administered 2018-10-20: 18:00:00 10 mg via INTRAVENOUS

## 2018-10-20 MED ORDER — LIDOCAINE (PF) 200 MG/10 ML (2 %) IJ SYRG
0 refills | Status: DC
Start: 2018-10-20 — End: 2018-10-20
  Administered 2018-10-20: 18:00:00 60 mg via INTRAVENOUS

## 2018-10-20 NOTE — Progress Notes
Patient returned call to [facility] and confirmed name and DOB. Patient advised that COVID-19 test results are negative. Advised that patient can continue with the procedure and should follow pre-procedure instructions. Advised patient to continue with home quarantine until procedure. Advised that if they develop any concerning symptoms prior to the procedure to contact their procedure team, specialist, and/or PCP for assistance.      , DNP, APRN, FNP-BC, CCRN, SCRN, CNRN

## 2018-10-20 NOTE — H&P (View-Only)
Pre Procedure History and Physical/Sedation Plan    Name:Alexis Weeks                                                                   MRN: [id number]                 DOB:26-Oct-1950          Age: 68 y.o.  Date of Service: 10/20/2018    Date of Procedure:  10/20/2018    Planned Procedure(s):  GI:  EUS  Sedation/Medication Plan: MAC (Monitored Anesthesia Care)  Discussion/Reviews:  Physician has discussed risks and alternatives of this type of sedation and above planned procedures with patient  ___________________________________________________________________  Chief Complaint:  Acute on chronic pancreatitis    History of Present Illness: Alexis Weeks is a 68 y.o. male with Acute on chronic pancreatitis    Previous Anesthetic/Sedation History:  MAC    Medical History:   Diagnosis Date   ??? A-fib (HCC)    ??? Chest pain     with activity   ??? COPD (chronic obstructive pulmonary disease) (HCC)    ??? Hepatitis     C, treated with pills    ??? History of GI bleed 12/01/2017    At [facility] in June, 2019.  Upper GI endoscopy did not identify a cause   ??? Hypertension      Surgical History:   Procedure Laterality Date   ??? SPINE SURGERY  2004   ??? HX TURP  2013   ??? HERNIA REPAIR  2014    Ventral Hernia   ??? ESOPHAGOGASTRODUODENOSCOPY ENDOSCOPIC ULTRASOUND N/A 04/01/2017    Performed by Vertell Novak, MD at [facility] ENDO   ??? BRONCHOSCOPY DIAGNOSTIC WITH/ WITHOUT CELL WASHING - FLEXIBLE N/A 09/12/2017    Performed by Cloretta Ned, MD at [facility] OR   ??? BRONCHOSCOPY WITH BRONCHIAL ALVEOLAR LAVAGE - FLEXIBLE N/A 09/12/2017    Performed by Cloretta Ned, MD at [facility] OR   ??? PERCUTANEOUS CLOSURE LEFT ATRIAL APPENDAGE WITH ENDOCARDIAL IMPLANT, TRANSEPTAL CATHETERIZATION, FOLEY, CONTRAST ANTICIPATED N/A 12/26/2017    Performed by Deniece Ree, MD at [facility] CVOR   ??? TRANSESOPHAGEAL ECHOCARDIOGRAM DURING INTERVENTION N/A 12/26/2017    Performed by Vena Austria, MD at [facility] CVOR ??? ESOPHAGOGASTRODUODENOSCOPY WITH SPECIMEN COLLECTION BY BRUSHING/ WASHING N/A 02/21/2018    Performed by Samuel Jester, MD at [facility] ENDO   ??? BRONCHOSCOPY WITH BRONCHIAL/ ENDOBRONCHIAL BIOPSY - FLEXIBLE N/A 04/24/2018    Performed by Audie Box, MD at [facility] OR   ??? BRONCHOSCOPY WITH BRONCHIAL ALVEOLAR LAVAGE - FLEXIBLE  04/24/2018    Performed by Audie Box, MD at [facility] OR   ??? CYSTOURETHROSCOPY WITH FULGURATION/ RESECTION BLADDER TUMOR N/A 05/22/2018    Performed by Barbra Sarks, MD at [facility] OR   ??? FRACTURE SURGERY  1970s    arm   ??? HX CHOLECYSTECTOMY  1970s   ??? INGUINAL HERNIA REPAIR Bilateral 1950s     Pertinent medical/surgical history reviewed  Pertinent family history reviewed  Social History     Tobacco Use   ??? Smoking status: Current Every Day Smoker     Packs/day: 0.50     Years: 51.00     Pack years: 25.50  Types: Cigarettes   ??? Smokeless tobacco: Never Used   Substance Use Topics   ??? Alcohol use: Yes     Alcohol/week: 2.0 standard drinks     Types: 2 Cans of beer per week     Frequency: Never   ??? Drug use: Not Currently     Types: Marijuana, Methamphetamines, Cocaine, Heroin     Comment: stopped street drugs in 2002      Social History     Substance and Sexual Activity   Drug Use Not Currently   ??? Types: Marijuana, Methamphetamines, Cocaine, Heroin    Comment: stopped street drugs in 2002      Allergies:  Penicillins  Medications  Current Facility-Administered Medications   Medication   ??? lactated ringers infusion     Review of Systems:  All other systems reviewed and are negative.           Physical Exam:      NPO Status:     Airway:  airway assessment performed  Mallampati II (soft palate, uvula, fauces visible)  Anesthesia Classification:  ASA III (A patient with a severe systemic disease that limits activity, but is not incapacitating)  NPO Status: Acceptable  Preganancy Status: N/A  Lab/Radiology/Other Diagnostic Tests  Labs:  Relevant labs reviewed      Kathrynn Ducking, MD  Pager

## 2018-10-20 NOTE — Anesthesia Post-Procedure Evaluation
Post-Anesthesia Evaluation    Name: Alexis Weeks      MRN: [id number]     DOB: [birth year]     Age: 68 y.o.     Sex: male   __________________________________________________________________________     Procedure Information     Anesthesia Start Date/Time:  10/20/18 1308    Procedure:  ESOPHAGOGASTRODUODENOSCOPY WITH ENDOSCOPIC ULTRASOUND EXAMINATION - FLEXIBLE (N/A )    Location:  ENDO 1 / ENDO/GI    Surgeon:  Threasa Alpha, MD          Post-Anesthesia Vitals  BP: 146/108 (08/07 1335)  Temp: 36.6 C (97.9 F) (08/07 1330)  Pulse: 99 (08/07 1335)  Respirations: 27 PER MINUTE (08/07 1335)  SpO2: 100 % (08/07 1330)  SpO2 Pulse: 94 (08/07 1335)   Vitals Value Taken Time   BP 146/108 10/20/2018  1:35 PM   Temp 36.6 C (97.9 F) 10/20/2018  1:30 PM   Pulse 99 10/20/2018  1:35 PM   Respirations 27 PER MINUTE 10/20/2018  1:35 PM   SpO2 100 % 10/20/2018  1:30 PM         Post Anesthesia Evaluation Note    Evaluation location: other (GI)  Patient participation: recovered; patient participated in evaluation  Level of consciousness: alert    Pain score: 0  Pain management: adequate    Hydration: normovolemia  Temperature: 36.0C - 38.4C  Airway patency: adequate    Perioperative Events       Post-op nausea and vomiting: no PONV    Postoperative Status  Cardiovascular status: hemodynamically stable  Respiratory status: spontaneous ventilation  Additional comments: Post-Anesthesia Evaluation Attestation: I reviewed and agree the indicated post-anesthesia care was provided. I have reviewed key portions of the indicated post anesthesia care. I have examined the patient's vitals, physical status, and complications and agree with what is documented.    Staff name:  Tacey Heap, MD Date:  10/20/2018          Perioperative Events  Perioperative Event: No

## 2018-10-21 ENCOUNTER — Encounter: Admit: 2018-10-21 | Discharge: 2018-10-21

## 2018-10-21 DIAGNOSIS — I10 Essential (primary) hypertension: Secondary | ICD-10-CM

## 2018-10-21 DIAGNOSIS — J449 Chronic obstructive pulmonary disease, unspecified: Secondary | ICD-10-CM

## 2018-10-21 DIAGNOSIS — Z8719 Personal history of other diseases of the digestive system: Secondary | ICD-10-CM

## 2018-10-21 DIAGNOSIS — R079 Chest pain, unspecified: Secondary | ICD-10-CM

## 2018-10-21 DIAGNOSIS — I4891 Unspecified atrial fibrillation: Secondary | ICD-10-CM

## 2018-10-21 DIAGNOSIS — K759 Inflammatory liver disease, unspecified: Secondary | ICD-10-CM

## 2018-10-23 ENCOUNTER — Encounter: Admit: 2018-10-23 | Discharge: 2018-10-23

## 2018-10-23 NOTE — Telephone Encounter
Patient returned call to [facility] stating he had received a text from [facility]. Discussed that COVID -19 from pre-procedure was negative; per chart review this has been discussed with patient previously. Patient with concerns regarding scheduling with a provider; APRN directed patient to contact that provider's office.    Crissie Sickles, DNP, APRN, FNP-BC, CCRN, [place], CNRN

## 2018-10-26 ENCOUNTER — Encounter: Admit: 2018-10-26 | Discharge: 2018-10-26

## 2018-10-26 DIAGNOSIS — Z8719 Personal history of other diseases of the digestive system: Secondary | ICD-10-CM

## 2018-10-26 DIAGNOSIS — R079 Chest pain, unspecified: Secondary | ICD-10-CM

## 2018-10-26 DIAGNOSIS — K860 Alcohol-induced chronic pancreatitis: Principal | ICD-10-CM

## 2018-10-26 DIAGNOSIS — Z1159 Encounter for screening for other viral diseases: Secondary | ICD-10-CM

## 2018-10-26 DIAGNOSIS — I4891 Unspecified atrial fibrillation: Secondary | ICD-10-CM

## 2018-10-26 DIAGNOSIS — I10 Essential (primary) hypertension: Secondary | ICD-10-CM

## 2018-10-26 DIAGNOSIS — J449 Chronic obstructive pulmonary disease, unspecified: Secondary | ICD-10-CM

## 2018-10-26 DIAGNOSIS — K759 Inflammatory liver disease, unspecified: Secondary | ICD-10-CM

## 2018-10-26 DIAGNOSIS — K861 Other chronic pancreatitis: Secondary | ICD-10-CM

## 2018-10-26 MED ORDER — TRAMADOL 100 MG PO TB24
100 mg | ORAL_TABLET | Freq: Three times a day (TID) | ORAL | 0 refills | Status: DC | PRN
Start: 2018-10-26 — End: 2019-01-29

## 2018-10-26 MED ORDER — LIPASE-PROTEASE-AMYLASE (VIOKACE) 20,880-78,300- 78,300 UNIT PO TAB
ORAL_TABLET | Freq: Three times a day (TID) | ORAL | 11 refills | 30.00000 days | Status: DC
Start: 2018-10-26 — End: 2019-01-18

## 2018-10-26 NOTE — Progress Notes
Telehealth Visit Note    Date of Service: 10/26/2018    Subjective:      Obtained patient's verbal consent to treat them and their agreement to [place] financial policy and NPP via this telehealth visit during the [facility]       Alexis Weeks is a 68 y.o. male.    History of Present Illness    This is a 68 year old Caucasian male with history of chronic pancreatitis who returns for follow-up.  He has had recurrent acute exacerbations of chronic pancreatitis.  He continues to have chronic abdominal pain also.  He describes this pain as being moderate to severe on occasions.  It is present all the time.  He is lost about 10 pounds in the last few weeks.  He continues to drink alcohol.  At present he is drinking 2 beers per day for the last 2 to 3 months.  Previous to that he was drinking up to 8 beers per day.  He continues to smoke also.  He denies any diarrhea.  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  His alkaline phosphatase is also elevated to 280.       Review of Systems   Constitutional: Positive for activity change, appetite change and unexpected weight change.   HENT: Positive for congestion, dental problem, drooling, ear discharge, ear pain, hearing loss, rhinorrhea, trouble swallowing and voice change.    Eyes: Positive for photophobia.   Respiratory: Positive for apnea, cough, choking, chest tightness and shortness of breath.    Cardiovascular: Positive for leg swelling.   Gastrointestinal: Positive for abdominal pain, blood in stool and vomiting.   Genitourinary: Positive for frequency.   Musculoskeletal: Positive for arthralgias, back pain, gait problem and joint swelling.   Neurological: Positive for dizziness, syncope, weakness and light-headedness.   Psychiatric/Behavioral: Positive for sleep disturbance.   Comprehensive 10 point ROS taken, and otherwise negative except as above. Active Ambulatory Problems     Diagnosis Date Noted   ??? Essential hypertension 02/24/2015   ??? 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 Chronic atrial fibrillation 02/24/2015   ??? Hx of spinal surgery 02/24/2015   ??? Pancreatic abnormality 09/08/2017   ??? 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 Underweight 09/08/2017   ??? History of GI bleed 12/01/2017   ??? Bronchiectasis without complication (HCC) 12/14/2017   ??? COPD (chronic obstructive pulmonary disease) (HCC) 12/14/2017   ??? 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 Moderate malnutrition (HCC) 04/21/2018     Resolved Ambulatory Problems     Diagnosis Date Noted   ??? Hemoptysis 09/08/2017   ??? Hematemesis 01/22/2018   ??? Hemoptysis 04/23/2018   ??? AKI (acute kidney injury) (HCC) 04/23/2018     Past Medical History:   Diagnosis Date   ??? A-fib (HCC)    ??? Chest pain    ??? Hepatitis    ??? Hypertension        Social History     Socioeconomic History   ??? Marital status: Divorced     Spouse name: Not on file   ??? Number of children: Not on file   ??? Years of education: Not on file   ??? Highest education level: Not on file   Occupational History   ??? Not on file   Tobacco Use   ??? Smoking status: Current Every Day Smoker  Packs/day: 0.50     Years: 51.00     Pack years: 25.50     Types: Cigarettes   ??? Smokeless tobacco: Never Used   Substance and Sexual Activity   ??? Alcohol use: Yes     Alcohol/week: 2.0 standard drinks     Types: 2 Cans of beer per week     Frequency: Never   ??? Drug use: Not Currently     Types: Marijuana, Methamphetamines, Cocaine, Heroin     Comment: stopped street drugs in 2002    ??? Sexual activity: Not on file   Other Topics Concern   ??? Not on file   Social History Narrative   ??? Not on file       Family History   Problem Relation Age of Onset   ??? Arthritis Mother    ??? Hearing Loss Mother    ??? Alcohol abuse Father    ??? COPD Sister    ??? Alcohol abuse Brother        Allergies   Allergen Reactions ??? Penicillins HIVES and EDEMA       Patient Active Problem List    Diagnosis Date Noted   ??? Moderate malnutrition (HCC) 04/21/2018   ??? 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 Bronchiectasis without complication (HCC) 12/14/2017   ??? COPD (chronic obstructive pulmonary disease) (HCC) 12/14/2017   ??? History of GI bleed 12/01/2017   ??? Pancreatic abnormality 09/08/2017   ??? 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 Underweight 09/08/2017   ??? Essential hypertension 02/24/2015   ??? 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 Chronic atrial fibrillation 02/24/2015   ??? Hx of spinal surgery 02/24/2015           Objective:         ??? albuterol (PROAIR HFA, VENTOLIN HFA, OR PROVENTIL HFA) 90 mcg/actuation inhaler Inhale 1-2 puffs by mouth into the lungs every 6 hours as needed for Wheezing or Shortness of Breath. Shake well before use.   ??? aspirin 81 mg chewable tablet Chew one tablet by mouth daily. Take with food.   ??? azithromycin (ZITHROMAX) 250 mg tablet Take two tablets by mouth three times weekly.   ??? cholecalciferol (VITAMIN D3) 2,000 unit tablet Take 2,000 Units by mouth daily.   ??? cyclobenzaprine (FLEXERIL) 10 mg tablet Take 10 mg by mouth three times daily as needed for Muscle Cramps.   ??? diltiazem CD (CARDIZEM CD) 240 mg capsule Take one capsule by mouth daily. (Patient taking differently: Take 240 mg by mouth daily with breakfast.)   ??? docusate (COLACE) 100 mg capsule Take 100 mg by mouth daily as needed for Constipation.   ??? gabapentin (NEURONTIN) 300 mg capsule Take 300 mg by mouth every 8 hours as needed.   ??? lisinopriL (ZESTRIL) 10 mg tablet Take 10 mg by mouth daily.   ??? metoprolol tartrate (LOPRESSOR) 100 mg tablet Take 100 mg by mouth twice daily.   ??? oxybutynin chloride (DITROPAN) 5 mg tablet Take one tablet by mouth three times daily as needed.   ??? oxycodone (ROXICODONE) 20 mg tablet Take 20 mg by mouth every 6 hours as needed ??? pancrelipase (CREON 24,000) 24,000-76,000 -120,000 units capsule Take three capsules by mouth three times daily with meals. Take 2 tabs by mouth twice daily with snacks. (Patient taking differently: Take 1 capsule by mouth twice daily with meals.)   ??? umeclidinium-vilanterol (ANORO ELLIPTA) 62.5-25 mcg/actuation inhaler Inhale one  puff by mouth into the lungs daily. (Patient taking differently: Inhale 1 puff by mouth into the lungs twice daily.)     Vitals:    10/26/18 0855   Temp: 36.5 ???C (97.7 ???F)   TempSrc: Oral   Weight: 59 kg (130 lb)   Height: 182.9 cm (72)   PainSc: Seven     Body mass index is 17.63 kg/m???.     Telehealth Patient Reported Theodoro Kos     Row Name 10/26/18 0855                Pain Score  SEVEN        Pain Loc  ABDOMEN              Physical Exam  Deferred as this was a telephone call visit       Assessment and Plan:  This is a 68 year old Caucasian male with history of chronic pancreatitis who returns for follow-up.  He has had recurrent acute exacerbations of chronic pancreatitis.  He continues to have chronic abdominal pain also.  He describes this pain as being moderate to severe on occasions.  It is present all the time.  He is lost about 10 pounds in the last few weeks.  He continues to drink alcohol.  At present he is drinking 2 beers per day for the last 2 to 3 months.  Previous to that he was drinking up to 8 beers per day.  He continues to smoke also.  He denies any diarrhea.  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  His alkaline phosphatase is also elevated to 280.    I discussed with the patient regarding the clinical presentation, symptoms, management and complications of chronic pancreatitis.  We discussed the relationship between chronic pancreatitis, smoking, alcohol as well as increased risk of pancreatic cancer.  I advised him to completely abstain from smoking and drinking. For the abdominal pain I will start him on tramadol 100 mg p.o. 3 times daily as needed.  We will change Creon to Viokase 3 with meals and 2 with snack  Given that he has stones in the pancreatic duct, compression of the pancreatic duct/stricture as well as upstream dilation I will schedule him for an ERCP to attempt pancreatic Endo-therapy.  Follow-up with Volney American in clinic in 3 months  Thank you for allowing [place] to participate in his care and please feel free to call us if you have any questions.                       25 minutes spent on this patient's encounter with counseling and coordination of care taking >50% of the visit.

## 2018-10-27 ENCOUNTER — Ambulatory Visit: Admit: 2018-10-26 | Discharge: 2018-10-27

## 2018-10-27 ENCOUNTER — Ambulatory Visit: Admit: 2018-11-03 | Discharge: 2018-11-03

## 2018-10-27 NOTE — Progress Notes
Called to prescreen for his ERCP - spoke to patient. Scheduled him on Friday, August 21 at 1330 to be in admissions at 1200. Prep reviewed over the phone until they where able to read back the instructions.    COVID 19 testing was set up for Wednesday, August 19. Teaching completed on this visit. Information on his COVID 19 visit including the instructions, date, time, and location.    ERCP (ENDOSCOPIC RETROGRADE CHOLANGIOPANCREATOGRAPHY)      ERCP stands for endoscopic retrograde cholangiopancreatography. This procedure is used to view the biliary and pancreatic ducts.  It is used to evaluate diseases that affect the biliary and pancreatic ducts.  It is also used to help find and treat blockages that may be present.      5 Days Prior:  1. Check with your prescribing physician for instructions about stopping your blood thinner.  Examples of blood thinners are Aleve, Aspirin. Coumadin, Eliquis, Ibuprofen, Naproxen, Plavix, and Xarelto.  2. Do not give yourself a Lovenox injection the morning of the test. Lovenox injections may be taken as usual through the day before your test.   3. Contact the office if you have an allergy to contrast dye. Medications will be ordered for you to take at home prior to your procedure. Contrast dye is injected through a catheter to make the duct show up better on the x-rays.   4. Discuss diabetic medications and insulin with the prescribing physician.      The Day of Your Exam:  1. Do not eat or drink anything after midnight, the night before your exam. Nothing by mouth. This includes GUM or CANDY.  a. Chewing tobacco must be stopped  (6) hours before your scheduled procedure.   b. If you have an early morning test, take ONLY your essential morning medications (heart, blood pressure, seizure, etc.) with a small sip of water.   2. You will be sedated for the procedure. A responsible adult must drive you home (no Benedetto Goad, taxis, or buses are allowed). If you do not have a driver we will be unable to do the test.  3. You will be here for (3-4) hours from arrival time.   4. You will not be able to return to work the same day.    5. Please bring a list of your current medications and the dosages with you.     The Procedure:  ? A thin tube (endoscope) is placed into your throat. It is advanced from the throat, through the upper digestive tract, to the common bile duct opening. The endoscope lets the healthcare provider see the common bile and pancreatic ducts on a video screen.  ? A cut may be made where the common bile duct opens to the duodenum to make it easier to remove stone(s).  ? An imaging technique that uses x-rays to obtain real-time moving images of internal organs is called fluoroscopy. It is used to watch and guide progress of the procedure.  ? In some cases, a plastic tube (stent) is placed to hold the ducts open. This stent will be replaced or left to fall out on its own and be passed in the stool. Please follow up with the office at [phone], to see if a return appointment is required.  ? The healthcare provider will discuss the results with you after the test. Biopsy results take about (10) business days.

## 2018-11-01 DIAGNOSIS — Z01818 Encounter for other preprocedural examination: Secondary | ICD-10-CM

## 2018-11-01 DIAGNOSIS — Z1159 Encounter for screening for other viral diseases: Principal | ICD-10-CM

## 2018-11-01 NOTE — Progress Notes
Patient arrived to [facility] for COVID-19 testing on 11/01/18 at 1207. Patient identity confirmed via photo I.D. Nasopharyngeal procedure explained to the patient.   Nasopharyngeal swab completed - right nare.  Patient education provided given and instructed patient self isolate until contacted w/ results and further instructions.   Swab collected by Tyson Dense, RT.    Date symptoms began/reason for testing: Pre procedure

## 2018-11-02 ENCOUNTER — Encounter: Admit: 2018-11-01 | Discharge: 2018-11-02

## 2018-11-02 LAB — COVID-19 (SARS-COV-2) PCR

## 2018-11-02 NOTE — Progress Notes
Contacted patient and confirmed name and DOB. Patient advised that COVID-19 test results are negative. Advised that patient can continue with the procedure and should follow pre-procedure instructions. Advised patient to continue with home quarantine until procedure. Advised that if they develop any concerning symptoms prior to the procedure to contact their procedure team, specialist, and/or PCP for assistance.

## 2018-11-03 ENCOUNTER — Encounter: Admit: 2018-11-03 | Discharge: 2018-11-03

## 2018-11-03 ENCOUNTER — Ambulatory Visit: Admit: 2018-11-03 | Discharge: 2018-11-03

## 2018-11-03 ENCOUNTER — Encounter: Admit: 2018-11-03 | Discharge: 2018-11-04

## 2018-11-03 DIAGNOSIS — I10 Essential (primary) hypertension: Secondary | ICD-10-CM

## 2018-11-03 DIAGNOSIS — K861 Other chronic pancreatitis: Principal | ICD-10-CM

## 2018-11-03 DIAGNOSIS — F17210 Nicotine dependence, cigarettes, uncomplicated: Secondary | ICD-10-CM

## 2018-11-03 DIAGNOSIS — Z880 Allergy status to penicillin: Secondary | ICD-10-CM

## 2018-11-03 DIAGNOSIS — Z8719 Personal history of other diseases of the digestive system: Secondary | ICD-10-CM

## 2018-11-03 DIAGNOSIS — J449 Chronic obstructive pulmonary disease, unspecified: Secondary | ICD-10-CM

## 2018-11-03 DIAGNOSIS — Z1159 Encounter for screening for other viral diseases: Secondary | ICD-10-CM

## 2018-11-03 DIAGNOSIS — K759 Inflammatory liver disease, unspecified: Secondary | ICD-10-CM

## 2018-11-03 DIAGNOSIS — I4891 Unspecified atrial fibrillation: Secondary | ICD-10-CM

## 2018-11-03 DIAGNOSIS — R079 Chest pain, unspecified: Secondary | ICD-10-CM

## 2018-11-03 MED ORDER — IOPAMIDOL 61 % IV SOLN
0 refills | Status: DC
Start: 2018-11-03 — End: 2018-11-03
  Administered 2018-11-03: 21:00:00 20 mL via INTRAMUSCULAR

## 2018-11-03 MED ORDER — SODIUM CHLORIDE 0.9 % IR SOLN
0 refills | Status: DC
Start: 2018-11-03 — End: 2018-11-03
  Administered 2018-11-03: 21:00:00 20 mL

## 2018-11-03 MED ORDER — SUCCINYLCHOLINE CHLORIDE 20 MG/ML IJ SOLN
INTRAVENOUS | 0 refills | Status: DC
Start: 2018-11-03 — End: 2018-11-03
  Administered 2018-11-03: 20:00:00 100 mg via INTRAVENOUS

## 2018-11-03 MED ORDER — PHENYLEPHRINE IN 0.9% NACL(PF) 1 MG/10 ML (100 MCG/ML) IV SYRG
INTRAVENOUS | 0 refills | Status: DC
Start: 2018-11-03 — End: 2018-11-03
  Administered 2018-11-03: 20:00:00 50 ug via INTRAVENOUS

## 2018-11-03 MED ORDER — METOPROLOL TARTRATE 5 MG/5 ML IV SOLN
0 refills | Status: DC
Start: 2018-11-03 — End: 2018-11-03
  Administered 2018-11-03 (×2): 5 mg via INTRAVENOUS

## 2018-11-03 MED ORDER — LIDOCAINE (PF) 200 MG/10 ML (2 %) IJ SYRG
0 refills | Status: DC
Start: 2018-11-03 — End: 2018-11-03
  Administered 2018-11-03: 20:00:00 60 mg via INTRAVENOUS

## 2018-11-03 MED ORDER — DEXTRAN 70-HYPROMELLOSE (PF) 0.1-0.3 % OP DPET
0 refills | Status: DC
Start: 2018-11-03 — End: 2018-11-03
  Administered 2018-11-03: 20:00:00 2 [drp] via OPHTHALMIC

## 2018-11-03 MED ORDER — METOCLOPRAMIDE HCL 5 MG/ML IJ SOLN
10 mg | Freq: Once | INTRAVENOUS | 0 refills | Status: DC | PRN
Start: 2018-11-03 — End: 2018-11-04

## 2018-11-03 MED ORDER — ESMOLOL 100 MG/10 ML (10 MG/ML) IV SOLN
0 refills | Status: DC
Start: 2018-11-03 — End: 2018-11-03
  Administered 2018-11-03: 20:00:00 30 mg via INTRAVENOUS
  Administered 2018-11-03: 20:00:00 20 mg via INTRAVENOUS

## 2018-11-03 MED ORDER — DEXAMETHASONE SODIUM PHOSPHATE 4 MG/ML IJ SOLN
INTRAVENOUS | 0 refills | Status: DC
Start: 2018-11-03 — End: 2018-11-03
  Administered 2018-11-03: 20:00:00 4 mg via INTRAVENOUS

## 2018-11-03 MED ORDER — ONDANSETRON HCL (PF) 4 MG/2 ML IJ SOLN
INTRAVENOUS | 0 refills | Status: DC
Start: 2018-11-03 — End: 2018-11-03
  Administered 2018-11-03: 21:00:00 4 mg via INTRAVENOUS

## 2018-11-03 MED ORDER — METOPROLOL TARTRATE 5 MG/5 ML IV SOLN
5 mg | Freq: Once | INTRAVENOUS | 0 refills | Status: DC
Start: 2018-11-03 — End: 2018-11-03

## 2018-11-03 MED ORDER — LACTATED RINGERS IV SOLP
INTRAVENOUS | 0 refills | Status: DC
Start: 2018-11-03 — End: 2018-11-04
  Administered 2018-11-03: 18:00:00 1000.000 mL via INTRAVENOUS

## 2018-11-03 MED ORDER — FENTANYL CITRATE (PF) 50 MCG/ML IJ SOLN
50 ug | INTRAVENOUS | 0 refills | Status: DC | PRN
Start: 2018-11-03 — End: 2018-11-04

## 2018-11-03 MED ORDER — FENTANYL CITRATE (PF) 50 MCG/ML IJ SOLN
0 refills | Status: DC
Start: 2018-11-03 — End: 2018-11-03
  Administered 2018-11-03 (×3): 50 ug via INTRAVENOUS

## 2018-11-03 MED ORDER — PROPOFOL INJ 10 MG/ML IV VIAL
0 refills | Status: DC
Start: 2018-11-03 — End: 2018-11-03
  Administered 2018-11-03: 20:00:00 100 mg via INTRAVENOUS
  Administered 2018-11-03: 20:00:00 50 mg via INTRAVENOUS

## 2018-11-03 MED ORDER — INDOMETHACIN 50 MG RE SUPP
0 refills | Status: DC
Start: 2018-11-03 — End: 2018-11-03
  Administered 2018-11-03: 21:00:00 2 via RECTAL

## 2018-11-03 NOTE — Telephone Encounter
-----   Message from Threasa Alpha, MD sent at 11/03/2018  3:46 PM CDT -----  Repeat ERCP in 6 weeks

## 2018-11-03 NOTE — Anesthesia Post-Procedure Evaluation
Post-Anesthesia Evaluation    Name: Alexis Weeks      MRN: [id number]     DOB: [birth year]     Age: 68 y.o.     Sex: male   __________________________________________________________________________     Procedure Information     Anesthesia Start Date/Time:  11/03/18 1432    Procedure:  ENDOSCOPIC RETROGRADE CHOLANGIOPANCREATOGRAPHY (N/A )    Location:  ENDO 1 / ENDO/GI    Surgeon:  Threasa Alpha, MD          Post-Anesthesia Vitals  BP: 176/104 (08/21 1650)  Pulse: 77 (08/21 1650)  Respirations: 14 PER MINUTE (08/21 1650)  SpO2: 99 % (08/21 1650)  SpO2 Pulse: 81 (08/21 1650)   Vitals Value Taken Time   BP 176/104 11/03/2018  4:50 PM   Temp 37 C (98.6 F) 11/03/2018  3:54 PM   Pulse 77 11/03/2018  4:50 PM   Respirations 14 PER MINUTE 11/03/2018  4:50 PM   SpO2 99 % 11/03/2018  4:50 PM         Post Anesthesia Evaluation Note    Evaluation location: Pre/Post  Patient participation: recovered; patient participated in evaluation  Level of consciousness: alert  Pain management: adequate    Hydration: normovolemia  Temperature: 36.0C - 38.4C  Airway patency: adequate    Perioperative Events       Post-op nausea and vomiting: no PONV    Postoperative Status  Cardiovascular status: hemodynamically stable  Respiratory status: spontaneous ventilation  Follow-up needed: none        Perioperative Events  Perioperative Event: No  Emergency Case Activation: No

## 2018-11-03 NOTE — Telephone Encounter
Order placed.

## 2018-11-03 NOTE — H&P (View-Only)
Pre Procedure History and Physical/Sedation Plan    Name:Alexis Weeks                                                                   MRN: [id number]                 DOB:[birth year]          Age: 68 y.o.    Date of Procedure:  11/03/2018    Planned Procedure(s):  GI:  ERCP  Sedation/Medication Plan: General Anesthesia  Discussion/Reviews:  Physician has discussed risks and alternatives of this type of sedation and above planned procedures with patient  ___________________________________________________________________  Chief Complaint:  Endotherapy for PD stones    History of Present Illness: Alexis Weeks is a 68 y.o. male with Endotherapy for PD stones    Previous Anesthetic/Sedation History:  GA    Medical History:   Diagnosis Date   ??? A-fib (HCC)    ??? Chest pain     with activity   ??? COPD (chronic obstructive pulmonary disease) (HCC)    ??? Hepatitis     C, treated with pills    ??? History of GI bleed 12/01/2017    At [facility] in June, 2019.  Upper GI endoscopy did not identify a cause   ??? Hypertension      Surgical History:   Procedure Laterality Date   ??? SPINE SURGERY  2004   ??? HX TURP  2013   ??? HERNIA REPAIR  2014    Ventral Hernia   ??? ESOPHAGOGASTRODUODENOSCOPY ENDOSCOPIC ULTRASOUND N/A 04/01/2017    Performed by Vertell Novak, MD at [facility] ENDO   ??? BRONCHOSCOPY DIAGNOSTIC WITH/ WITHOUT CELL WASHING - FLEXIBLE N/A 09/12/2017    Performed by Cloretta Ned, MD at [facility] OR   ??? BRONCHOSCOPY WITH BRONCHIAL ALVEOLAR LAVAGE - FLEXIBLE N/A 09/12/2017    Performed by Cloretta Ned, MD at [facility] OR   ??? PERCUTANEOUS CLOSURE LEFT ATRIAL APPENDAGE WITH ENDOCARDIAL IMPLANT, TRANSEPTAL CATHETERIZATION, FOLEY, CONTRAST ANTICIPATED N/A 12/26/2017    Performed by Deniece Ree, MD at [facility] CVOR   ??? TRANSESOPHAGEAL ECHOCARDIOGRAM DURING INTERVENTION N/A 12/26/2017    Performed by Vena Austria, MD at [facility] CVOR   ??? ESOPHAGOGASTRODUODENOSCOPY WITH SPECIMEN COLLECTION BY BRUSHING/ WASHING N/A 02/21/2018    Performed by Samuel Jester, MD at [facility] ENDO   ??? BRONCHOSCOPY WITH BRONCHIAL/ ENDOBRONCHIAL BIOPSY - FLEXIBLE N/A 04/24/2018    Performed by Audie Box, MD at [facility] OR   ??? BRONCHOSCOPY WITH BRONCHIAL ALVEOLAR LAVAGE - FLEXIBLE  04/24/2018    Performed by Audie Box, MD at [facility] OR   ??? CYSTOURETHROSCOPY WITH FULGURATION/ RESECTION BLADDER TUMOR N/A 05/22/2018    Performed by Barbra Sarks, MD at [facility] OR   ??? ESOPHAGOGASTRODUODENOSCOPY WITH ENDOSCOPIC ULTRASOUND EXAMINATION - FLEXIBLE N/A 10/20/2018    Performed by Vertell Novak, MD at [facility] ENDO   ??? FRACTURE SURGERY  1970s    arm   ??? HX CHOLECYSTECTOMY  1970s   ??? INGUINAL HERNIA REPAIR Bilateral 1950s     Pertinent medical/surgical history reviewed  Pertinent family history reviewed  Social History     Tobacco Use   ??? Smoking status: Current Every Day Smoker  Packs/day: 0.50     Years: 51.00     Pack years: 25.50     Types: Cigarettes   ??? Smokeless tobacco: Never Used   Substance Use Topics   ??? Alcohol use: Yes     Alcohol/week: 2.0 standard drinks     Types: 2 Cans of beer per week     Frequency: Never   ??? Drug use: Not Currently     Types: Marijuana, Methamphetamines, Cocaine, Heroin     Comment: stopped street drugs in 2002      Social History     Substance and Sexual Activity   Drug Use Not Currently   ??? Types: Marijuana, Methamphetamines, Cocaine, Heroin    Comment: stopped street drugs in 2002      Allergies:  Penicillins  Medications  Current Facility-Administered Medications   Medication   ??? lactated ringers infusion     Review of Systems:  All other systems reviewed and are negative.           Physical Exam:  Temp: 36.8 ???C (98.2 ???F) (08/21 1308)  Pulse: 97 (08/21 1308)  Respirations: 21 PER MINUTE (08/21 1308)  BP: 156/99 (08/21 1308)    NPO Status:     Airway:  airway assessment performed  Mallampati II (soft palate, uvula, fauces visible)  Anesthesia Classification:  ASA III (A patient with a severe systemic disease that limits activity, but is not incapacitating)  NPO Status: Acceptable  Preganancy Status: N/A    Lab/Radiology/Other Diagnostic Tests  Labs:  Relevant labs reviewed      Kathrynn Ducking, MD  Pager

## 2018-11-03 NOTE — Discharge Instructions - Supplementary Instructions
EGD/Upper EUS/ERCP/Antegrade Enteroscopy  Post Upper Endoscopy Instructions  -You may have a sore throat after the procedure for 2-3 days.  Try sucrets or lozenges to help ease the pain.  If it continues please contact us.    -If you feel feverish, have a temperature of 101 degrees or higher, persistent nausea and vomiting, abdominal pain or dark stools; please notify your nurse or GI physician.    -You may have abdominal cramping following the procedure this can be relieved by belching or passing air.    -If you have redness or swelling at the IV site, place a warm, wet washcloth over the affected areas for 15 minutes, 3-4 times a day until the redness subsides.  If symptoms continue for 2-3 days, contact your regular physician.    - If you have bleeding from your mouth, over 2 tablespoons and increasing, please notify your physician.  A small amount of bleeding is normal if a biopsy or polyps were taken.  If you are vomiting blood you need to seek immediate medical attention.    - You may resume all your routine medications, if medications need to be held your physician and/or nurse will notify you post procedure.    SPECIFIC INSTRUCTIONS    OUTPATIENTS:  A. Because of sedation and lack of coordination, FOR THE NEXT 24 HOURS, DO NOT:  1. Operate any motorized vehicle - this includes driving.  2. Sign any legal documents or conduct important business matters.  3. Use any dangerous machinery (chain saw, lawnmower, etc.).  4. Drink any alcoholic beverages.  Should you have any questions or concerns after your procedure please call [phone] M-F 8am-5:00 pm. After 5:00 pm, holidays or weekends call [phone] and ask for the GI Doctor on call.

## 2018-11-06 ENCOUNTER — Ambulatory Visit: Admit: 2018-11-06 | Discharge: 2018-11-06

## 2018-11-06 DIAGNOSIS — K861 Other chronic pancreatitis: Secondary | ICD-10-CM

## 2018-11-06 NOTE — Progress Notes
Called to schedule his repeat ERCP- spoke to patient. He is now scheduled for 12/15/2018 at 1300 to be in admissions at 1130. Pt informed that he/she will receive a call two weeks prior to his/her appointment to go over the prep. Pt states understanding.     Over the coarse of the conversation, pt was asked how he is feeling. He states he has been "unwell". Reports abdominal pain and history of vomiting up an spitting up blood. Pt directed to go to his nearest ED. He states understanding and will call his care giver for a ride.

## 2018-11-14 ENCOUNTER — Encounter: Admit: 2018-11-14 | Discharge: 2018-11-14

## 2018-11-14 DIAGNOSIS — I10 Essential (primary) hypertension: Secondary | ICD-10-CM

## 2018-11-14 DIAGNOSIS — I482 Chronic atrial fibrillation, unspecified: Secondary | ICD-10-CM

## 2018-11-14 NOTE — Telephone Encounter
-----   Message from Arminda Resides, BSN sent at 10/31/2018  3:54 PM CDT -----  Regarding: FW: DEA study follow up  Schedule 1 year TEE  ----- Message -----  From: Brett Canales  Sent: 10/31/2018   3:49 PM CDT  To: Cvm Laac  Subject: DEA study follow up                              Hello,    This patient needs scheduled for their 1 year procedure follow up, their window is 12/07/2018 - 01/04/2019 and will also need a TEE and labs (CBC & BMP). They can be scheduled with either Reece Levy or Dendi.    Please let me know if you have any questions.    Thanks!    Chasity Ward

## 2018-11-14 NOTE — Telephone Encounter
Spoke with the patient to schedule one year post Watchman implant TEE as a part of DEA study. 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 Patient states he has been having acute issues with pancreatitis and hematemesis. This is a known issue and patient follows with GI closely. Patient had an EGD in December 2019 which did not reveal any significant abnormalities.     Patient has been in and out of the hospital every few months for hematemesis with most recently in June. Per discharge notes: GI was consulted for evaluation of his hematemesis.  However, there were no episodes of hematemesis or hemoptysis during this hospitalization.  His hemoglobin was stable throughout the hospitalization.  It was not necessary to repeat an EGD during this admission.  He did recently have an EGD 6 months ago with similar complaints that was unremarkable.    Ultimately patient underwent EGD with EUS on 8/7 with Dr. Candi Leash. 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 Given that he has stones in the pancreatic duct, compression of the pancreatic duct/stricture as well as upstream dilation I will schedule him for an ERCP to attempt pancreatic Endo-therapy. This surgery is planned for 12/15/2018.    Will review with YMR.

## 2018-11-16 ENCOUNTER — Encounter: Admit: 2018-11-16 | Discharge: 2018-11-16

## 2018-11-16 DIAGNOSIS — I482 Chronic atrial fibrillation, unspecified: Secondary | ICD-10-CM

## 2018-11-16 DIAGNOSIS — Z95818 Presence of other cardiac implants and grafts: Secondary | ICD-10-CM

## 2018-11-16 NOTE — Telephone Encounter
Reviewed with YMR. Recommends OV in October after his GI surgery. Patient is agreeable to OV on 10/15 at [facility].

## 2018-11-29 ENCOUNTER — Encounter: Admit: 2018-11-29 | Discharge: 2018-11-29

## 2018-11-29 ENCOUNTER — Ambulatory Visit: Admit: 2018-11-29 | Discharge: 2018-11-29 | Payer: MEDICAID

## 2018-11-29 ENCOUNTER — Ambulatory Visit: Admit: 2018-11-29 | Discharge: 2018-11-29

## 2018-11-29 IMAGING — CR CHEST
3 series · 3 of 3 positions shown · non-contrast
Comparison: none

[chest pa x-wise (1 of 2)]
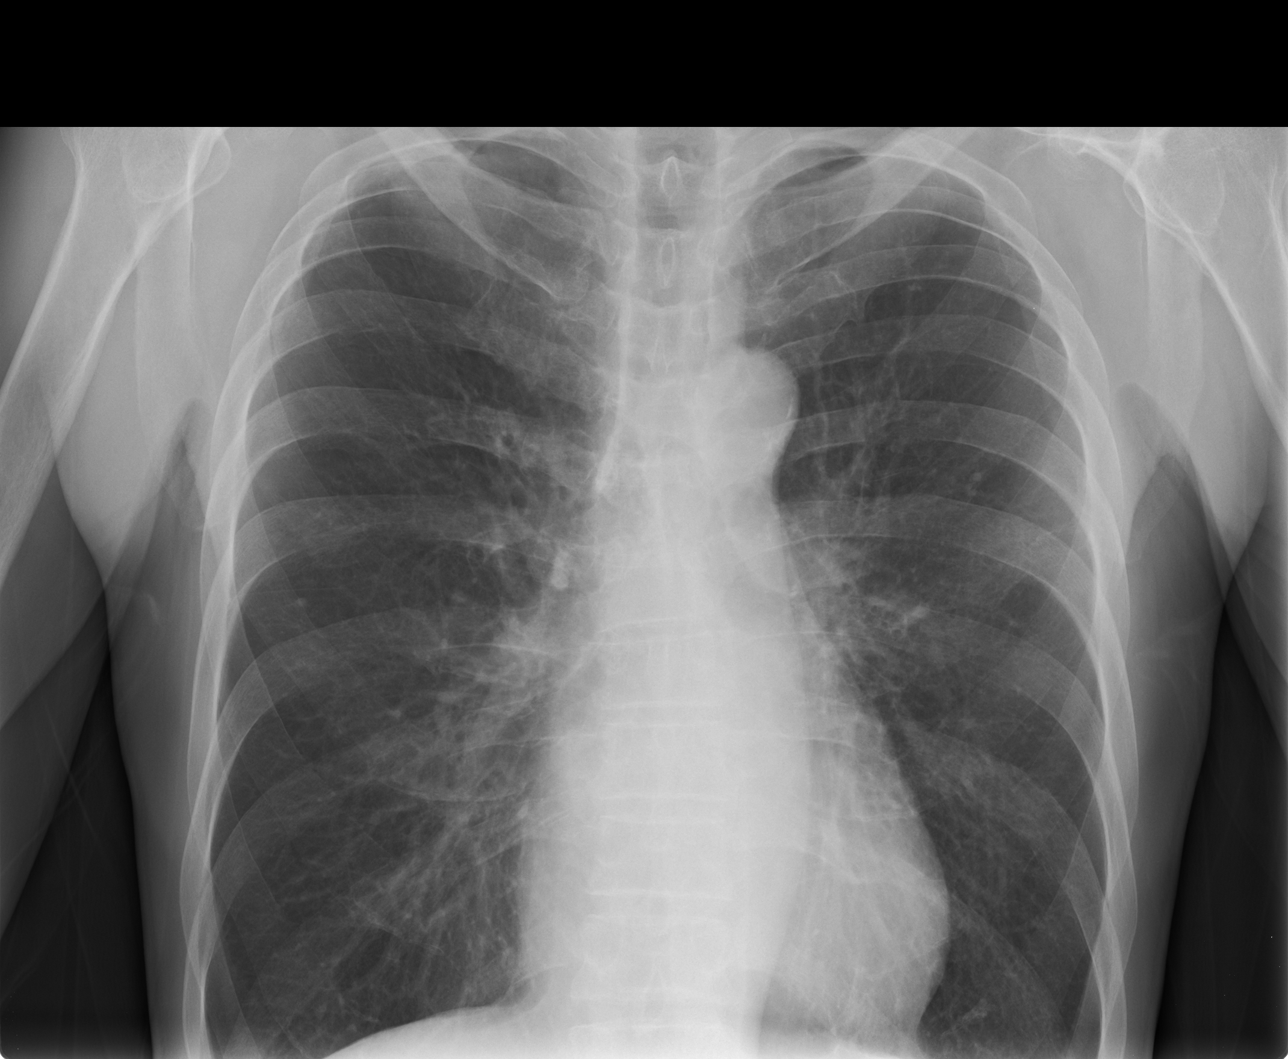

[chest pa x-wise (2 of 2)]
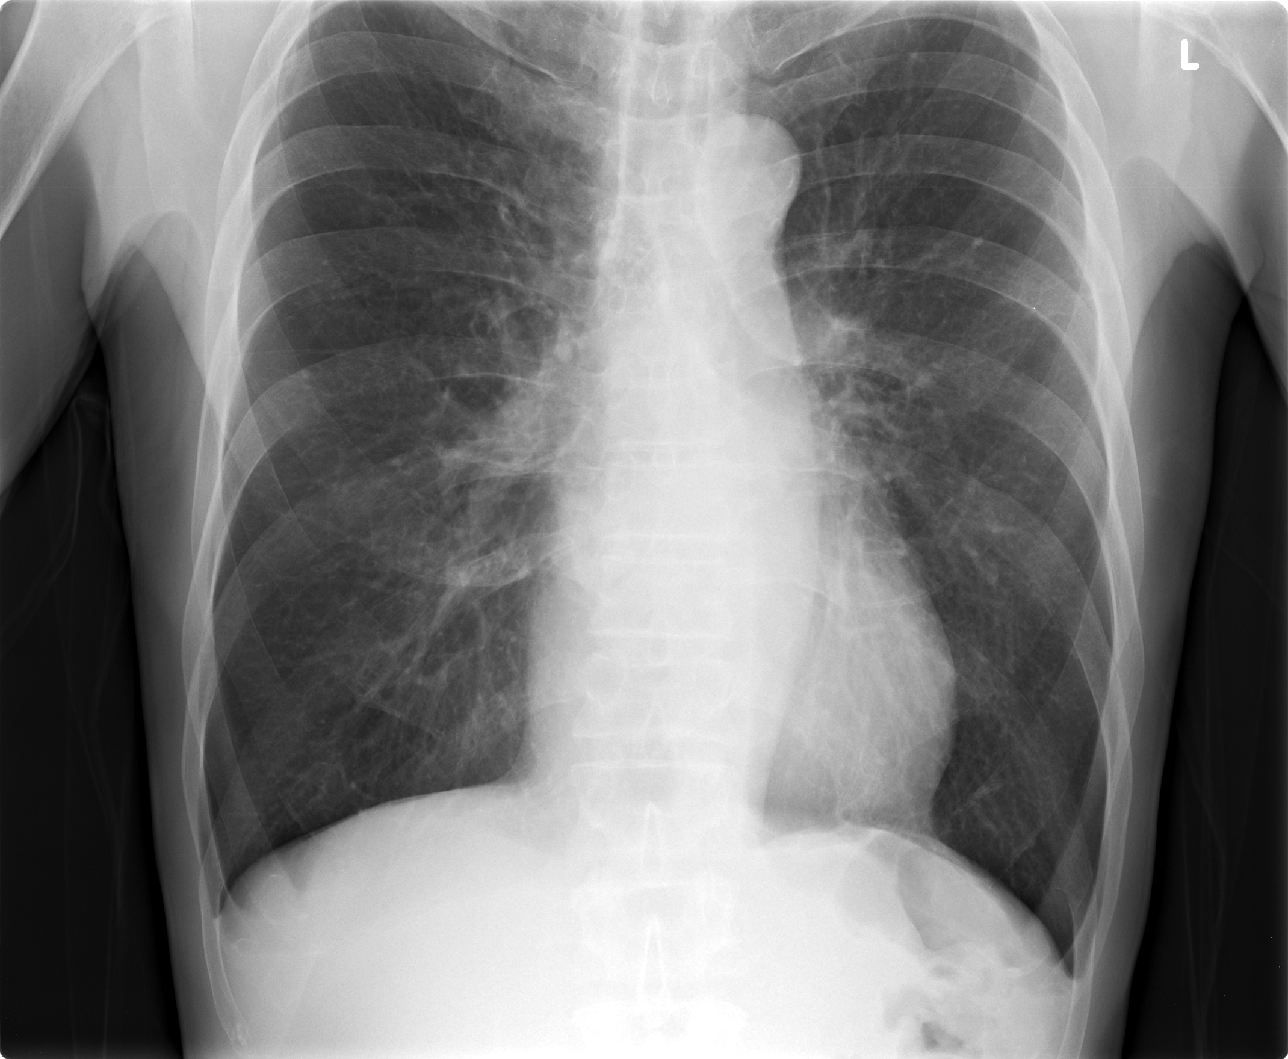

[chest lat]
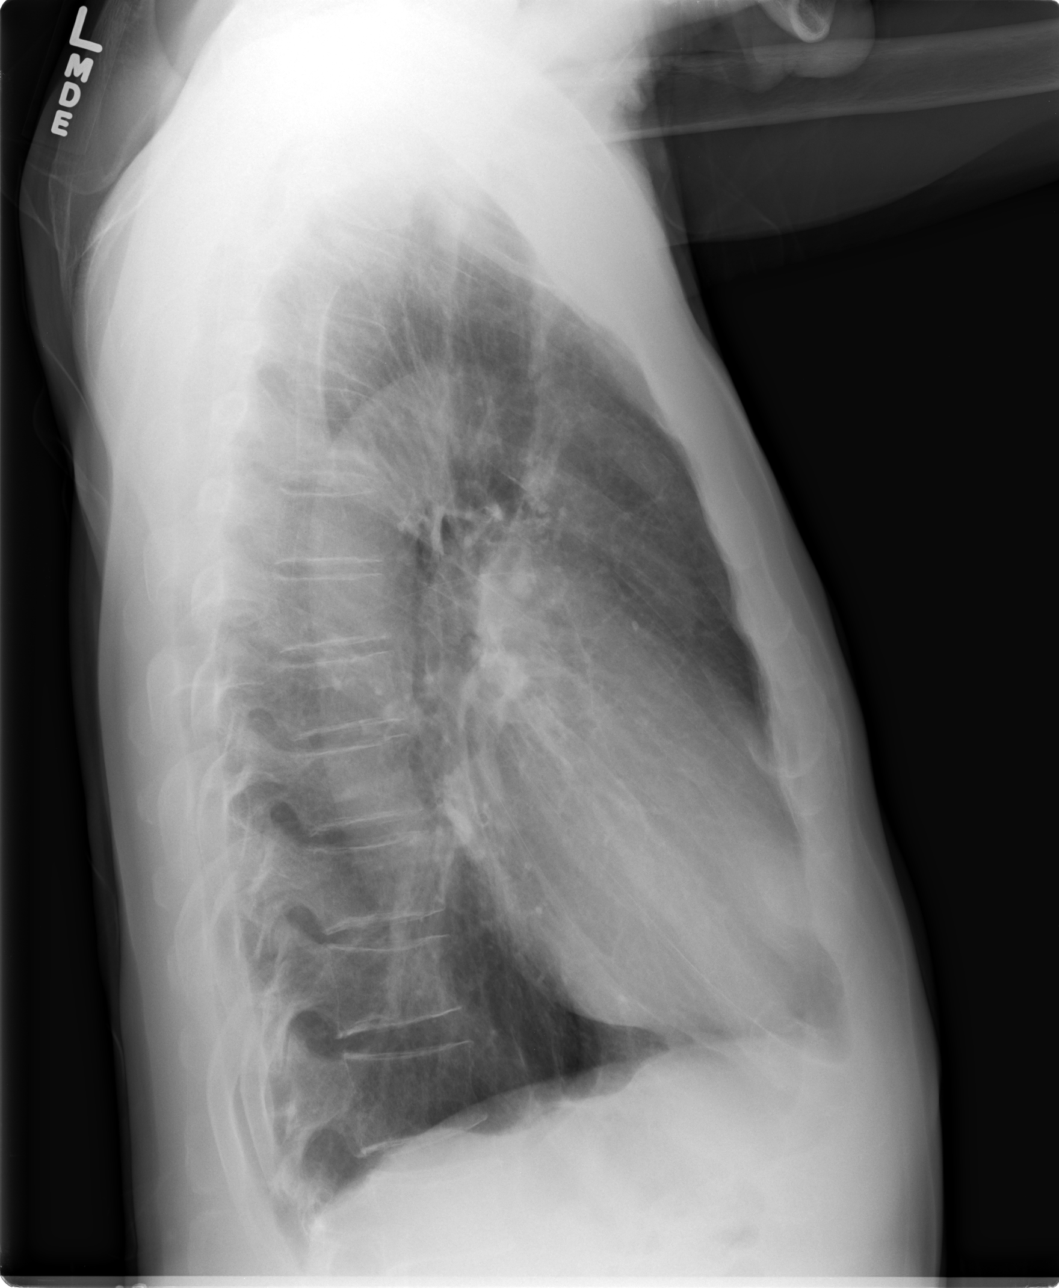

[3 of 3 positions shown; findings below may reference images not displayed]

EXAM

RADIOLOGICAL EXAMINATION, CHEST; 2 VIEWS FRONTAL AND LATERAL CPT 43606

INDICATION

cough
COUGHING UP BLOOD X 02/02/17. H/O 2QT6E2-X PK/DAY X 40 YEARS. ME

TECHNIQUE

2 views of the chest were acquired.

COMPARISONS

Previous examination dated 10/08/2015.

FINDINGS

The cardiac silhouette is within normal limits. The lungs are clear and hyperinflated. Flattening
of the diaphragms is noted. There is peribronchial thickening. This is likely reflective of chronic
bronchitis. The pulmonary vasculature is normal in caliber.

IMPRESSION

Clear hyperinflated lungs. Findings are consistent with the patient's known history of COPD. No
focal lobar consolidations. Central peribronchial thickening likely related to chronic bronchitis.

## 2018-11-29 MED ORDER — ANORO ELLIPTA 62.5-25 MCG/ACTUATION IN DSDV
1 | Freq: Every day | RESPIRATORY_TRACT | 11 refills | Status: AC
Start: 2018-11-29 — End: ?

## 2018-11-29 MED ORDER — ALBUTEROL SULFATE 90 MCG/ACTUATION IN HFAA
1-2 | RESPIRATORY_TRACT | 1 refills | Status: AC | PRN
Start: 2018-11-29 — End: ?

## 2018-11-29 MED ORDER — AZITHROMYCIN 250 MG PO TAB
500 mg | ORAL_TABLET | ORAL | 1 refills | Status: AC
Start: 2018-11-29 — End: ?

## 2018-11-29 NOTE — Patient Instructions
Continue your current medicines.   Please get the sweat chloride checked at your earliest convenience.     My nurse is Estanislado Spire, Therapist, sports. She can be reached at [phone].  Please contact my nurse with any signs and symptoms of worsening productive cough with thick secretions, blood in sputum, chest tightness/pain, shortness of breath, fever, chills, night sweats, or any questions or concerns.  For refills on medications, please have your pharmacy fax a refill authorization request form to our office at [phone]. Please allow at least 3 business days for refill requests.  For urgent issues after business hours/weekends/holidays call [phone] and request for the pulmonary fellow to be paged.

## 2018-12-04 ENCOUNTER — Encounter: Admit: 2018-12-04 | Discharge: 2018-12-04

## 2018-12-04 NOTE — Telephone Encounter
Pt called and LVM that he has questions regarding his upcoming procedure. Pt received his prep instructions in the mail this weekend and no longer has questions at this time.

## 2018-12-05 ENCOUNTER — Encounter: Admit: 2018-12-05 | Discharge: 2018-12-05

## 2018-12-13 ENCOUNTER — Encounter: Admit: 2018-12-13 | Discharge: 2018-12-14 | Payer: MEDICAID

## 2018-12-13 DIAGNOSIS — Z1159 Encounter for screening for other viral diseases: Secondary | ICD-10-CM

## 2018-12-15 ENCOUNTER — Encounter: Admit: 2018-12-15 | Discharge: 2018-12-15

## 2018-12-15 ENCOUNTER — Ambulatory Visit: Admit: 2018-12-15 | Discharge: 2018-12-15

## 2018-12-15 DIAGNOSIS — J449 Chronic obstructive pulmonary disease, unspecified: Secondary | ICD-10-CM

## 2018-12-15 DIAGNOSIS — I4891 Unspecified atrial fibrillation: Secondary | ICD-10-CM

## 2018-12-15 DIAGNOSIS — R079 Chest pain, unspecified: Secondary | ICD-10-CM

## 2018-12-15 DIAGNOSIS — K759 Inflammatory liver disease, unspecified: Secondary | ICD-10-CM

## 2018-12-15 DIAGNOSIS — I10 Essential (primary) hypertension: Secondary | ICD-10-CM

## 2018-12-15 DIAGNOSIS — Z8719 Personal history of other diseases of the digestive system: Secondary | ICD-10-CM

## 2018-12-15 LAB — COVID-19 (SARS-COV-2) PCR

## 2018-12-15 MED ORDER — LABETALOL 5 MG/ML IV SYRG
5 mg | Freq: Once | INTRAVENOUS | 0 refills | Status: CP
Start: 2018-12-15 — End: ?
  Administered 2018-12-15: 19:00:00 5 mg via INTRAVENOUS

## 2018-12-15 MED ORDER — FENTANYL CITRATE (PF) 50 MCG/ML IJ SOLN
50 ug | INTRAVENOUS | 0 refills | Status: CN | PRN
Start: 2018-12-15 — End: ?

## 2018-12-15 MED ORDER — ONDANSETRON HCL (PF) 4 MG/2 ML IJ SOLN
4 mg | Freq: Once | INTRAVENOUS | 0 refills | Status: CN | PRN
Start: 2018-12-15 — End: ?

## 2018-12-15 MED ORDER — LIDOCAINE (PF) 200 MG/10 ML (2 %) IJ SYRG
0 refills | Status: DC
Start: 2018-12-15 — End: 2018-12-15
  Administered 2018-12-15: 18:00:00 100 mg via INTRAVENOUS

## 2018-12-15 MED ORDER — SUCCINYLCHOLINE CHLORIDE 20 MG/ML IJ SOLN
INTRAVENOUS | 0 refills | Status: DC
Start: 2018-12-15 — End: 2018-12-15
  Administered 2018-12-15: 18:00:00 120 mg via INTRAVENOUS

## 2018-12-15 MED ORDER — INDOMETHACIN 50 MG RE SUPP
0 refills | Status: DC
Start: 2018-12-15 — End: 2018-12-15
  Administered 2018-12-15 (×2): 1 via RECTAL

## 2018-12-15 MED ORDER — IOPAMIDOL 61 % IV SOLN
0 refills | Status: DC
Start: 2018-12-15 — End: 2018-12-15
  Administered 2018-12-15: 18:00:00 30 mL via INTRAMUSCULAR

## 2018-12-15 MED ORDER — FENTANYL CITRATE (PF) 50 MCG/ML IJ SOLN
0 refills | Status: DC
Start: 2018-12-15 — End: 2018-12-15
  Administered 2018-12-15: 18:00:00 100 ug via INTRAVENOUS

## 2018-12-15 MED ORDER — PROPOFOL INJ 10 MG/ML IV VIAL
0 refills | Status: DC
Start: 2018-12-15 — End: 2018-12-15
  Administered 2018-12-15: 18:00:00 100 mg via INTRAVENOUS

## 2018-12-15 NOTE — Anesthesia Post-Procedure Evaluation
Post-Anesthesia Evaluation    Name: Alexis Weeks      MRN: [id number]     DOB: 1950-07-07     Age: 68 y.o.     Sex: male   __________________________________________________________________________     Procedure Information     Anesthesia Start Date/Time:  12/15/18 1252    Procedures:       ENDOSCOPIC RETROGRADE CHOLANGIOPANCREATOGRAPHY (N/A )      ENDOSCOPIC RETROGRADE CHOLANGIOPANCREATOGRAPHY WITH REMOVAL FOREIGN BODY/ STENT FROM BILIARY/ PANCREATIC DUCT    Location:  ENDO 2 / ENDO/GI    Surgeon:  Threasa Alpha, MD          Post-Anesthesia Vitals  BP: 163/125 (10/02 1430)  Pulse: 92 (10/02 1430)  Respirations: 15 PER MINUTE (10/02 1430)  SpO2: 100 % (10/02 1430)  SpO2 Pulse: 89 (10/02 1430)   Vitals Value Taken Time   BP 163/125 12/15/2018  2:30 PM   Temp     Pulse 92 12/15/2018  2:30 PM   Respirations 15 PER MINUTE 12/15/2018  2:30 PM   SpO2 100 % 12/15/2018  2:30 PM         Post Anesthesia Evaluation Note    Evaluation location: Pre/Post  Patient participation: recovered; patient participated in evaluation  Level of consciousness: alert    Pain score: 0  Pain management: adequate    Hydration: normovolemia  Temperature: 36.0C - 38.4C  Airway patency: adequate    Perioperative Events       Post-op nausea and vomiting: no PONV    Postoperative Status  Cardiovascular status: hemodynamically stable  Respiratory status: spontaneous ventilation        Perioperative Events  Perioperative Event: No  Emergency Case Activation: No

## 2018-12-27 ENCOUNTER — Encounter: Admit: 2018-12-27 | Discharge: 2018-12-27

## 2018-12-27 DIAGNOSIS — Z006 Encounter for examination for normal comparison and control in clinical research program: Secondary | ICD-10-CM

## 2018-12-27 DIAGNOSIS — I4819 Other persistent atrial fibrillation: Secondary | ICD-10-CM

## 2018-12-28 ENCOUNTER — Encounter: Admit: 2018-12-28 | Discharge: 2018-12-28

## 2018-12-28 ENCOUNTER — Ambulatory Visit: Admit: 2018-12-28 | Discharge: 2018-12-28

## 2018-12-28 ENCOUNTER — Ambulatory Visit: Admit: 2018-12-28 | Discharge: 2018-12-28 | Payer: MEDICAID

## 2018-12-28 DIAGNOSIS — Z1159 Encounter for screening for other viral diseases: Secondary | ICD-10-CM

## 2018-12-28 DIAGNOSIS — I4819 Other persistent atrial fibrillation: Secondary | ICD-10-CM

## 2018-12-28 DIAGNOSIS — Z8719 Personal history of other diseases of the digestive system: Secondary | ICD-10-CM

## 2018-12-28 DIAGNOSIS — R079 Chest pain, unspecified: Secondary | ICD-10-CM

## 2018-12-28 DIAGNOSIS — K759 Inflammatory liver disease, unspecified: Secondary | ICD-10-CM

## 2018-12-28 DIAGNOSIS — I4891 Unspecified atrial fibrillation: Secondary | ICD-10-CM

## 2018-12-28 DIAGNOSIS — I482 Chronic atrial fibrillation, unspecified: Secondary | ICD-10-CM

## 2018-12-28 DIAGNOSIS — J449 Chronic obstructive pulmonary disease, unspecified: Secondary | ICD-10-CM

## 2018-12-28 DIAGNOSIS — I10 Essential (primary) hypertension: Secondary | ICD-10-CM

## 2018-12-28 DIAGNOSIS — Z006 Encounter for examination for normal comparison and control in clinical research program: Secondary | ICD-10-CM

## 2018-12-28 LAB — CBC
Lab: 11 g/dL — ABNORMAL LOW (ref 13.5–16.5)
Lab: 3.4 M/UL — ABNORMAL LOW (ref 4.4–5.5)
Lab: 7.7 10*3/uL — ABNORMAL HIGH (ref 4.5–11.0)

## 2018-12-28 LAB — BASIC METABOLIC PANEL
Lab: 142 MMOL/L (ref 137–147)
Lab: 4.4 MMOL/L (ref 3.5–5.1)

## 2018-12-28 MED ORDER — DILTIAZEM HCL 360 MG PO CP24
360 mg | ORAL_CAPSULE | Freq: Every day | ORAL | 3 refills | 90.00000 days | Status: DC
Start: 2018-12-28 — End: 2019-04-11

## 2018-12-31 ENCOUNTER — Encounter: Admit: 2018-12-31 | Discharge: 2018-12-31

## 2018-12-31 DIAGNOSIS — I10 Essential (primary) hypertension: Secondary | ICD-10-CM

## 2018-12-31 DIAGNOSIS — K759 Inflammatory liver disease, unspecified: Secondary | ICD-10-CM

## 2018-12-31 DIAGNOSIS — I4891 Unspecified atrial fibrillation: Secondary | ICD-10-CM

## 2018-12-31 DIAGNOSIS — Z8719 Personal history of other diseases of the digestive system: Secondary | ICD-10-CM

## 2018-12-31 DIAGNOSIS — R079 Chest pain, unspecified: Secondary | ICD-10-CM

## 2018-12-31 DIAGNOSIS — J449 Chronic obstructive pulmonary disease, unspecified: Secondary | ICD-10-CM

## 2019-01-02 ENCOUNTER — Encounter: Admit: 2019-01-02 | Discharge: 2019-01-03 | Payer: MEDICAID

## 2019-01-02 ENCOUNTER — Encounter: Admit: 2019-01-02 | Discharge: 2019-01-02

## 2019-01-02 DIAGNOSIS — Z1159 Encounter for screening for other viral diseases: Secondary | ICD-10-CM

## 2019-01-02 NOTE — Telephone Encounter
Returned call to patient. He has spoken with TEE lab already. Patient had all of his questions answered. Reviewed plan with the patient. Patient verbalized understanding and does not have any further questions or concerns. No further education requested from patient. Patient has our contact information for future needs.

## 2019-01-02 NOTE — Telephone Encounter
-----   Message from Louis Meckel, LPN sent at [id number]  4:05 PM CDT -----  Regarding: YMR- procedure ?  VM from male [phone] on triage line.  Michela Pitcher that they have questions about the TEE procedure on 01-05-19.  Gerald Stabs is at [phone].

## 2019-01-02 NOTE — Telephone Encounter
Pt Phone # [phone] VM okay? Yes  Type of Test TEE  Other Appts No      Check in time:   1200    Please check in at the Cardiovascular Medicine clinic     Have you had any recent Cold, flu, or infections: No  Please have nothing by mouth after Midnight on Wednesday  (MAC anes 8 hrs) (Dobut 4 hrs) (treadmill 4 hr )    Medications:  Hold these meds-  OTC meds & vitamin supplements  If on insulin: Do you have an insulin pump in place?   Take these meds with sips of water the morning of your test: all prescribed meds  Anticoagulation: ASA  any missed doses?   No    Please wear comfortable clothing and comfortable shoes if walking on treadmill,   Please refrain from applying lotions to the skin prior to your test,  Please remove metal jewelry if having DCCV    Name of driver if sedated: Caregiver      RN Called KB      Call TEE at [phone] for any other questions  [facility] [phone]/Scheduling [phone]  [address] [place], [place] [address]      Appointment information:       Swab status:   No    COVID symptoms? No    Patient experiencing any life-threatening symptoms? No  Extreme difficulty breathing, blue lips/face, severe/constant pain or pressure in the chest, altered mental status, slurred speech, seizure, coughing up blood, too weak to stand, etc.  - If yes, 911 or ED recommended.     Screening: GREEN     The following education provided to the patient:    Please plan to keep your appointment as scheduled. For your safety and the safety of everybody entering the health system, we have adjusted our appointment check in process.  Wear a mask when in the building if you have one, if you do not one will be provided to you. Once you have arrived, go straight to the temperature screening station. The staff at the temperature screening station will provide instructions for proceeding to the clinic. Upon arrival in the clinic, you will be escorted to an exam room or treatment area to complete the appointment check-in.  Your clinic team will give directions for how to proceed once your appointment is completed. Do you have any questions?    As a reminder, our visitor policy has been changed to better protect our patients and staff. No visitors are allowed unless there has been a pre-approved exception. We recognize this is difficult and encourage you to utilize Facetime or a speaker phone function during your visit.  We also encourage family and friends to return home to await news of their loved ones, but they may wait in their vehicle if they wish. Unless you have a scheduled pediatric appointment, visitors under the age of 82 will not be allowed in clinic until further notice.

## 2019-01-03 ENCOUNTER — Encounter: Admit: 2019-01-03 | Discharge: 2019-01-03

## 2019-01-03 LAB — COVID-19 (SARS-COV-2) PCR

## 2019-01-04 ENCOUNTER — Ambulatory Visit: Admit: 2019-01-04 | Discharge: 2019-01-04

## 2019-01-04 ENCOUNTER — Ambulatory Visit: Admit: 2019-01-04 | Discharge: 2019-01-04 | Payer: MEDICAID

## 2019-01-04 ENCOUNTER — Encounter: Admit: 2019-01-04 | Discharge: 2019-01-04

## 2019-01-04 DIAGNOSIS — I482 Chronic atrial fibrillation, unspecified: Secondary | ICD-10-CM

## 2019-01-04 DIAGNOSIS — Z95818 Presence of other cardiac implants and grafts: Secondary | ICD-10-CM

## 2019-01-04 MED ORDER — LIDOCAINE (PF) 200 MG/10 ML (2 %) IJ SYRG
0 refills | Status: DC
Start: 2019-01-04 — End: 2019-01-04
  Administered 2019-01-04: 18:00:00 50 mg via INTRAVENOUS

## 2019-01-04 MED ORDER — PROPOFOL 10 MG/ML IV EMUL 20 ML (INFUSION)(AM)(OR)
INTRAVENOUS | 0 refills | Status: DC
Start: 2019-01-04 — End: 2019-01-04
  Administered 2019-01-04: 18:00:00 100 ug/kg/min via INTRAVENOUS

## 2019-01-04 MED ORDER — PROPOFOL INJ 10 MG/ML IV VIAL
0 refills | Status: DC
Start: 2019-01-04 — End: 2019-01-04
  Administered 2019-01-04: 18:00:00 20 mg via INTRAVENOUS
  Administered 2019-01-04: 18:00:00 50 mg via INTRAVENOUS

## 2019-01-04 MED ORDER — SODIUM CHLORIDE 0.9 % IV SOLP (OR) 500ML
0 refills | Status: DC
Start: 2019-01-04 — End: 2019-01-04
  Administered 2019-01-04: 18:00:00 via INTRAVENOUS
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
## 2019-01-04 NOTE — Patient Instructions
CARDIOLOGY PROCEDURES           POST SEDATION INSTRUCTIONS      Patient Name: Alexis Weeks  MRN#: [id number]  Date: 01/04/2019      Please follow the instructions listed below:    ? The following day you may experience a minor sore throat.     Please have someone accompany you, as YOU SHOULD NOT drive or operate machinery for at least 12-24 hours following the procedure.    ? There may be some residual effects from the sedatives during the procedure.  ? Do not drive a vehicle for up to 24 hours after receiving sedation.  ? Do not operate heavy or potentially harmful equipment  ? Do not make legally binding decisions  ? Do not drink alcohol for up to 24 hours  ? Do not communicate through social media for 24 hours.       If you have question or concerns about this procedure, please contact the Cardiology office at [phone], and ask to speak to one of the nurses.      Current Medications List:  ? albuterol sulfate (PROAIR HFA) 90 mcg/actuation HFA aerosol inhaler Inhale one puff to two puffs by mouth into the lungs every 6 hours as needed for Wheezing or Shortness of Breath. Shake well before use.   ? aspirin 81 mg chewable tablet Chew one tablet by mouth daily. Take with food.   ? azithromycin (ZITHROMAX) 250 mg tablet Take two tablets by mouth three times weekly.   ? cholecalciferol (VITAMIN D3) 2,000 unit tablet Take 2,000 Units by mouth daily.   ? cyclobenzaprine (FLEXERIL) 10 mg tablet Take 10 mg by mouth three times daily as needed for Muscle Cramps.   ? diltiazem CD (CARDIZEM CD) 360 mg capsule Take one capsule by mouth daily.   ? docusate (COLACE) 100 mg capsule Take 100 mg by mouth daily as needed for Constipation.   ? gabapentin (NEURONTIN) 300 mg capsule Take 300 mg by mouth every 8 hours as needed.   ? lipase-protease-amylase (VIOKASE 20) 20,880 unit tablet Take four tablets by mouth three times daily with meals AND three tablets with snacks. Indications: exocrine pancreatic insufficiency   ? lisinopriL (ZESTRIL) 10 mg tablet Take 10 mg by mouth daily.   ? metoprolol tartrate (LOPRESSOR) 100 mg tablet Take 100 mg by mouth twice daily.   ? oxybutynin chloride (DITROPAN) 5 mg tablet Take one tablet by mouth three times daily as needed.   ? oxycodone (ROXICODONE) 20 mg tablet Take 20 mg by mouth every 6 hours as needed     ? traMADol ER (ULTRAM-ER) 100 mg tablet Take one tablet by mouth three times daily as needed for Pain.   ? umeclidinium-vilanteroL (ANORO ELLIPTA) 62.5-25 mcg/actuation inhaler Inhale one puff by mouth into the lungs daily.         Instructions Given To: pt and caregiver Asher Muir    Instructions Given By: Hilbert Corrigan, RN

## 2019-01-04 NOTE — Progress Notes
Care Plan   Care Category & Patient Outcome Goal Met Treatment/  Interventions Plan of the Day RN Name   Cardiovascular  Hemodynamic stability and adequate peripheral perfusion.   Yes   Refer to  patient's chart. Monitor VS per sedation standard. Monitor ECG continuously.  Assess/maintain IV patency.    , RN   Respiratory  Patent airway, ease of respiration, and adequate oxygenation.   Yes   Refer to  patient's chart. Maintain open airway. Assess respirations. Monitor O2 saturations. Titrate O2 to keep sat>= 95% or baseline.    , RN   Psychological/Emotional/Spiritual  Cope with procedure with support in place.  Spiritual needs are addressed.     Yes   Refer to  patient's chart. Provide adequate and thorough instructions.  Provide a caring and supportive environment.  Communicate patients concerns with other members of the health team.    , RN   Pain  Patients pain goal met.   Yes   Refer to  patient's chart. Prepare patient for potentially uncomfortable procedure.  Observe for verbal/nonverbal complaints of pain.  Assess pain.  Provide comfort measures.    , RN   Safety/Fall Risk  Free from injury, security maintained.   Yes High Risk    Refer to  patient's chart.   Follow nursing standard of practice for high risks fall patients.    , RN   Knowledge Base  Verbalize understanding of procedure/information provided.   Yes   Refer to  patient's chart. Describe the procedure along with what symptoms to expect.  Evaluate patients understanding of procedure.  Encourage patient to ask questions.  Provide additional information as needed.    , RN

## 2019-01-04 NOTE — Anesthesia Post-Procedure Evaluation
Post-Anesthesia Evaluation    Name: Alexis Weeks      MRN: [id number]     DOB: May 06, 1950     Age: 68 y.o.     Sex: male   __________________________________________________________________________     Procedure Information     Anesthesia Start Date/Time:  01/04/19 1247    Scheduled providers:  Gwenevere Ghazi, DO    Procedure:  TRANSESOPHAGEAL ECHO    Location:  [facility]  BP: 157/87 (10/22 1320)  Pulse: 80 (10/22 1320)  Respirations: 21 PER MINUTE (10/22 1320)  SpO2: 97 % (10/22 1320)  Height: 182.9 cm (72") (10/22 1319)   Vitals Value Taken Time   BP 157/87 01/04/2019  1:20 PM   Temp     Pulse 80 01/04/2019  1:20 PM   Respirations 21 PER MINUTE 01/04/2019  1:20 PM   SpO2 97 % 01/04/2019  1:20 PM         Post Anesthesia Evaluation Note    Evaluation location: Pre/Post  Patient participation: recovered; patient participated in evaluation  Level of consciousness: alert    Pain score: 0  Pain management: adequate    Hydration: normovolemia  Temperature: 36.0C - 38.4C  Airway patency: adequate    Perioperative Events       Post-op nausea and vomiting: no PONV    Postoperative Status  Cardiovascular status: hemodynamically stable  Respiratory status: spontaneous ventilation  Follow-up needed: none        Perioperative Events  Perioperative Event: No  Emergency Case Activation: No

## 2019-01-15 ENCOUNTER — Encounter: Admit: 2019-01-15 | Discharge: 2019-01-15

## 2019-01-15 NOTE — Telephone Encounter
Key Code: [id number]  Medication: Viokase #390 per 30 days  Can take 24-72 hours for determination.   FYI- Patients plan prefers patient to try 2 of there preferred medication before trying any non-preferred medications.  Preferred- Creon, Pancreaze, Zenpep (patient has been on Creon)

## 2019-01-18 ENCOUNTER — Encounter: Admit: 2019-01-18 | Discharge: 2019-01-18

## 2019-01-18 MED ORDER — PANCREAZE 21,000-54,700- 83,900 UNIT PO CPDR
1 | ORAL_CAPSULE | ORAL | 3 refills | 30.00000 days | Status: AC
Start: 2019-01-18 — End: ?

## 2019-01-29 ENCOUNTER — Encounter: Admit: 2019-01-29 | Discharge: 2019-01-29

## 2019-01-29 ENCOUNTER — Ambulatory Visit: Admit: 2019-01-29 | Discharge: 2019-01-29 | Payer: MEDICAID

## 2019-01-29 DIAGNOSIS — K861 Other chronic pancreatitis: Secondary | ICD-10-CM

## 2019-01-29 DIAGNOSIS — R042 Hemoptysis: Secondary | ICD-10-CM

## 2019-01-29 DIAGNOSIS — Z8719 Personal history of other diseases of the digestive system: Secondary | ICD-10-CM

## 2019-01-29 DIAGNOSIS — J449 Chronic obstructive pulmonary disease, unspecified: Secondary | ICD-10-CM

## 2019-01-29 DIAGNOSIS — R079 Chest pain, unspecified: Secondary | ICD-10-CM

## 2019-01-29 DIAGNOSIS — I4891 Unspecified atrial fibrillation: Secondary | ICD-10-CM

## 2019-01-29 DIAGNOSIS — R748 Abnormal levels of other serum enzymes: Secondary | ICD-10-CM

## 2019-01-29 DIAGNOSIS — K759 Inflammatory liver disease, unspecified: Secondary | ICD-10-CM

## 2019-01-29 DIAGNOSIS — I10 Essential (primary) hypertension: Secondary | ICD-10-CM

## 2019-01-29 NOTE — Progress Notes
Telephone Visit Note    Date of Service: 01/29/2019    Subjective:      Obtained patient's verbal consent to treat them and their agreement to [facility] financial policy and NPP via this telehealth visit during the [facility]       Alexis Weeks is a 68 y.o. male.    History of Present Illness Alexis Weeks is a very pleasant 68 yo male who is an established patient of Dr. Keenan Bachelor.  Most recent telephone visit with Dr. Keenan Bachelor was on 11/03/2018.  Patient has a significant past medical history including chronic pancreatitis, chronic hepatitis C which is reportedly in remission, as well as persistent atrial fibrillation, h/o severe GI bleed while on Eliquis, hypertension, chronic back/neck pain, who underwent LAAO with WATCHMAN device on 12/26/2017.   When he visited with Dr. Keenan Bachelor in 10/2018, he reported ongoing chronic abdominal pain which could become moderate to severe on occasions with an associated 10 pound weight loss over the previous few weeks.  He did admit to drinking 2 beers per day for 2 to 3 months and continuing tobacco use.  No diarrhea.  Most recent EUS on 10/20/2018 showed stones in the pancreatic duct, compression of the pancreatic duct in the head with upstream dilation.  His alkaline phosphatase on 09/06/2018 was also elevated to 280.  Encouraged to completely abstain from alcohol and tobacco use, try tramadol 100 mg 3 times a day as needed for abdominal pain, and change pancreatic enzymes from Creon to Viokase to see if helpful for his chronic abdominal pain.  ERCP was also recommended.  ERCP on 12/15/2018 revealed normal limited views of the esophagus, stomach, and duodenum.  Biliary and pancreatic stent was removed.  PD duct was easily cannulated with stone extraction balloon although balloon sweeps were performed without extraction of any stones or sludge.  Occlusion cholangiogram revealed a normal-appearing pancreatic duct that was about 4 mm in diameter.  No evidence of stricture or stone seen in the PD.  Good outflow of contrast from the PD.  The distal portion of the CBD was 6 to 7 mm without stricture.  ? I am visiting with him today via telephone for a 25-month follow-up visit.  Overall, he states his abdominal pain has improved significantly since his visit with Dr. Keenan Bachelor in 10/2018. He is not sure if he has had repeat liver enzymes drawn since that time.  For chronic abdominal pain and chronic neck/back pain, he is taking oxycodone 4 times a day as prescribed by his PCP, Dr. Nedra Hai.  He is no longer taking tramadol.  Viokase was not approved by his insurance so he is taking Pancreaze 21,000 lipase units, 3 with meals, 2 with snacks.  He denies any issues with diarrhea.  He is having 2-3 formed stools per day.  No rectal bleeding or melena.  No issues with nausea, or vomiting.  His appetite and weight are stable.  His chief concern today is reports of spitting up blood intermittently for the past 4 days.  He denies any shortness of air, chest pain, or fever.  He plans to follow-up with his PCP, Dr. Nedra Hai, tomorrow in regards to these concerns.  No issues with dysphagia, odynophagia, or pyrosis.  He does continue to smoke tobacco.  He states he has cut back some on alcohol, only having an occasional beer.    Of note, negative celiac serology in 10/2016.  Most recent lab work available for review is from 09/06/2018.  Normal  total bilirubin.  AST 68, AST 27, alkaline phosphatase 280.         Review of Systems   Constitutional: Positive for activity change. Negative for appetite change, chills, diaphoresis, fatigue, fever and unexpected weight change.   HENT: Negative for mouth sores, sore throat, trouble swallowing and voice change.    Eyes: Negative for pain and visual disturbance.   Respiratory: Positive for cough. Negative for choking.    Cardiovascular: Negative for chest pain and leg swelling.   Gastrointestinal: Positive for abdominal pain. Negative for abdominal distention, anal bleeding, blood in stool, constipation, diarrhea, nausea, rectal pain and vomiting.   Genitourinary: Negative for flank pain. Musculoskeletal: Positive for back pain and neck pain. Negative for arthralgias.   Skin: Negative for rash.   Neurological: Negative for light-headedness and headaches.   Hematological: Negative for adenopathy.   All other systems reviewed and are negative.        Objective:         ? albuterol sulfate (PROAIR HFA) 90 mcg/actuation HFA aerosol inhaler Inhale one puff to two puffs by mouth into the lungs every 6 hours as needed for Wheezing or Shortness of Breath. Shake well before use.   ? aspirin 81 mg chewable tablet Chew one tablet by mouth daily. Take with food.   ? azithromycin (ZITHROMAX) 250 mg tablet Take two tablets by mouth three times weekly.   ? cholecalciferol (VITAMIN D3) 2,000 unit tablet Take 2,000 Units by mouth daily.   ? cyclobenzaprine (FLEXERIL) 10 mg tablet Take 10 mg by mouth three times daily as needed for Muscle Cramps.   ? diltiazem CD (CARDIZEM CD) 360 mg capsule Take one capsule by mouth daily.   ? docusate (COLACE) 100 mg capsule Take 100 mg by mouth daily as needed for Constipation.   ? gabapentin (NEURONTIN) 300 mg capsule Take 300 mg by mouth every 8 hours as needed.   ? lipase-protease-amylase (PANCREAZE) 21,000-54,700- 83,900 units capsule Take one capsule by mouth as directed. Take 3 tablets with every meal and 2 tablets with everysnack   ? lisinopriL (ZESTRIL) 10 mg tablet Take 10 mg by mouth daily.   ? metoprolol tartrate (LOPRESSOR) 100 mg tablet Take 100 mg by mouth twice daily.   ? oxybutynin chloride (DITROPAN) 5 mg tablet Take one tablet by mouth three times daily as needed.   ? oxycodone (ROXICODONE) 20 mg tablet Take 20 mg by mouth every 6 hours as needed     ? traMADol ER (ULTRAM-ER) 100 mg tablet Take one tablet by mouth three times daily as needed for Pain.   ? umeclidinium-vilanteroL (ANORO ELLIPTA) 62.5-25 mcg/actuation inhaler Inhale one puff by mouth into the lungs daily.     There were no vitals filed for this visit. There is no height or weight on file to calculate BMI.     Telehealth Patient Reported Alexis Weeks     Row Name 01/29/19 0836                Weight:  63.5 kg (140 lb)        Height:  182.9 cm (72)        Pain Score:  SEVEN        Pain Location:  ABDOMEN back and neck              Physical Exam  Psychiatric:         Mood and Affect: Mood normal.         Behavior: Behavior normal.  Thought Content: Thought content normal.         Judgment: Judgment normal.              Assessment and Plan:    1.  Chronic pancreatitis with history of recurrent acute exacerbations.  Currently, doing fairly well and denies any recent exacerbations of abdominal pain since EUS 10/2018.  Viokase not covered by insurance so he is currently taking Pancreaze 21,000 lipase units, 3 with meals, less with snacks.  He is also taking oxycodone 4 times a day for his chronic abdominal pain as well as chronic neck/back pain, prescribed by PCP.  Longer taking tramadol.  He does continue to smoke tobacco and drinks alcohol occasionally.     Most recent lab work for review was from 08/2018 -alkaline phosphatase 280, AST 68, AST 27. Most recent EUS on 10/20/2018 showed stones in the pancreatic duct, compression of the pancreatic duct in the head with upstream dilation. ERCP on 12/15/2018 revealed normal limited views of the esophagus, stomach, and duodenum.  Biliary and pancreatic stent was removed.  PD duct was easily cannulated with stone extraction balloon although balloon sweeps were performed without extraction of any stones or sludge.  Occlusion cholangiogram revealed a normal-appearing pancreatic duct that was about 4 mm in diameter.  No evidence of stricture or stone seen in the PD.  Good outflow of contrast from the PD.  The distal portion of the CBD was 6 to 7 mm without stricture. 2.  Hemoptysis.  Patient reports spitting up blood for the past 4 days.  Denies chest pain, shortness of breath, fever, or chills.  No nausea or vomiting.  No rectal bleeding or melena.  Reports has appointment with PCP for evaluation tomorrow.    3.  Reported history of adenomatous colon polyps.  Reports most recent colonoscopy in 2017 by outside provider.    4.  History of chronic hepatitis C, reportedly in remission.    5.  History of chronic tobacco and polysubstance use. Currently, patient continues to smoke tobacco and drink alcohol occasionally.  Denies street drug use.    Plan:     1. CBC, CMP.   2. See PCP, Dr. Nedra Hai tomorrow regarding hemoptysis.  3.  Continue pancreatic enzymes with meals.  4.  We discussed the importance of complete alcohol and tobacco abstinence.  He was encouraged to follow-up with Dr. Lee/PCP to discuss support to achieve this. We discussed the relationship between chronic pancreatitis, smoking, alcohol as well as increased risk of pancreatic cancer.  5.  He will plan to see me for a follow-up visit in 3 months or so.  He is to call or return sooner if he starts to have exacerbations of abdominal pain.  6.  Will be due for surveillance colonoscopy in 2022 given history of adenomatous colon polyps.  Sooner if indicated.    Patient was advised of the plan and voiced agreement and understanding.      This note was in part completed with Dragon, a voice to text dictation system. Some errors may have occured and persist despite my best efforts to edit this document to eliminate dictation/translation?related errors.? If you have questions/concerns, please contact me for clarification.                          30 minutes spent on this patient's encounter with counseling and coordination of care taking >50% of the visit.

## 2019-01-29 NOTE — Patient Instructions
1. See Dr. Truman Hayward as planned tomorrow to discuss your issues with coughing up blood.     2. I have place orders for blood work to be done if not ordered by Dr. Truman Hayward tomorrow. We will call you with those results.     3. Important for you to stop all alcohol and tobacco use given your chronic pancreatitis. Talk to Dr. Truman Hayward about help to quit smoking.     4. Plan to see me for an in person visit in 3 months or so, call my nurse Arrica as needed in the meantime.     5. Continue the pancreatic enzymes with meals.     Please call my nurse Arrica if you have any questions or concerns. [phone].

## 2019-01-30 ENCOUNTER — Encounter: Admit: 2019-01-30 | Discharge: 2019-01-30

## 2019-01-30 ENCOUNTER — Emergency Department: Admit: 2019-01-30 | Discharge: 2019-01-30

## 2019-01-30 ENCOUNTER — Inpatient Hospital Stay: Admit: 2019-01-30 | Payer: MEDICAID

## 2019-01-30 ENCOUNTER — Inpatient Hospital Stay: Admit: 2019-01-30 | Discharge: 2019-01-30

## 2019-01-30 DIAGNOSIS — J449 Chronic obstructive pulmonary disease, unspecified: Secondary | ICD-10-CM

## 2019-01-30 DIAGNOSIS — R7989 Other specified abnormal findings of blood chemistry: Secondary | ICD-10-CM

## 2019-01-30 DIAGNOSIS — I4891 Unspecified atrial fibrillation: Secondary | ICD-10-CM

## 2019-01-30 DIAGNOSIS — Z8719 Personal history of other diseases of the digestive system: Secondary | ICD-10-CM

## 2019-01-30 DIAGNOSIS — K759 Inflammatory liver disease, unspecified: Secondary | ICD-10-CM

## 2019-01-30 DIAGNOSIS — R0902 Hypoxemia: Secondary | ICD-10-CM

## 2019-01-30 DIAGNOSIS — K2960 Other gastritis without bleeding: Secondary | ICD-10-CM

## 2019-01-30 DIAGNOSIS — R079 Chest pain, unspecified: Secondary | ICD-10-CM

## 2019-01-30 DIAGNOSIS — I10 Essential (primary) hypertension: Secondary | ICD-10-CM

## 2019-01-30 DIAGNOSIS — R042 Hemoptysis: Principal | ICD-10-CM

## 2019-01-30 DIAGNOSIS — R918 Other nonspecific abnormal finding of lung field: Secondary | ICD-10-CM

## 2019-01-30 LAB — POC POTASSIUM: Lab: 3.9 MMOL/L (ref 3.5–5.1)

## 2019-01-30 LAB — RVP VIRAL PANEL PCR

## 2019-01-30 LAB — CBC AND DIFF
Lab: 1 % (ref 0–2)
Lab: 168 10*3/uL (ref 150–400)
Lab: 2 % (ref 0–5)
Lab: 3.2 10*3/uL (ref 1.0–4.8)
Lab: 3.3 M/UL — ABNORMAL LOW (ref 4.4–5.5)
Lab: 33 g/dL (ref 32.0–36.0)
Lab: 38 % (ref 24–44)
Lab: 4.3 K/UL (ref 1.8–7.0)
Lab: 50 % (ref 41–77)
Lab: 8.6 10*3/uL (ref 4.5–11.0)
Lab: 8.9 FL (ref 7–11)
Lab: 9 % (ref 4–12)
Lab: 96 FL (ref 80–100)

## 2019-01-30 LAB — LIPASE: Lab: 20 U/L (ref 11–82)

## 2019-01-30 LAB — BLOOD GASES, ARTERIAL
Lab: 2.5 MMOL/L
Lab: 22 MMOL/L (ref 21–28)
Lab: 38 mmHg (ref 35–45)
Lab: 7.3 (ref 7.35–7.45)
Lab: 85 mmHg (ref 80–100)
Lab: 96 % (ref 95–99)
Lab: 2.7 MMOL/L (ref 32.0–36.0)
Lab: 22 MMOL/L (ref 21–28)
Lab: 329 mmHg — ABNORMAL HIGH (ref 80–100)
Lab: 52 mmHg — ABNORMAL HIGH (ref 35–45)
Lab: 7.2 % — ABNORMAL LOW (ref 7.35–7.45)
Lab: 99 % — ABNORMAL HIGH (ref 95–99)

## 2019-01-30 LAB — TROPONIN-I: Lab: 0 ng/mL (ref 0.0–0.05)

## 2019-01-30 LAB — PHOSPHORUS: Lab: 4.2 mg/dL (ref 2.0–4.5)

## 2019-01-30 LAB — COMPREHENSIVE METABOLIC PANEL
Lab: 0.8 mg/dL (ref 0.3–1.2)
Lab: 1.1 mg/dL (ref 0.4–1.24)
Lab: 106 MMOL/L (ref 98–110)
Lab: 11 U/L (ref 7–56)
Lab: 111 U/L — ABNORMAL HIGH (ref 25–110)
Lab: 138 MMOL/L (ref 137–147)
Lab: 17 U/L (ref 7–40)
Lab: 182 mg/dL — ABNORMAL HIGH (ref <20.7)
Lab: 20 MMOL/L — ABNORMAL LOW (ref 21–30)
Lab: 3.9 MMOL/L (ref 3.5–5.1)
Lab: 4.2 g/dL (ref 3.5–5.0)
Lab: 7 g/dL (ref 6.0–8.0)
Lab: >60 mL/min (ref >60)
Lab: >60 mL/min (ref >60)
Lab: 12 (ref 3–12)

## 2019-01-30 LAB — URINALYSIS DIPSTICK REFLEX TO CULTURE
Lab: NEGATIVE
Lab: NEGATIVE
Lab: NEGATIVE
Lab: NEGATIVE
Lab: NEGATIVE
Lab: NEGATIVE
Lab: NEGATIVE

## 2019-01-30 LAB — POC SODIUM: Lab: 139 MMOL/L (ref 137–147)

## 2019-01-30 LAB — LEGIONELLA ANTIGEN URINE,RAN: Lab: NEGATIVE

## 2019-01-30 LAB — BARBITURATES-URINE RANDOM: Lab: NEGATIVE

## 2019-01-30 LAB — PTT (APTT): Lab: 26 s — ABNORMAL LOW (ref 24.0–36.5)

## 2019-01-30 LAB — CBC
Lab: 10 10*3/uL (ref 4.5–11.0)
Lab: 139 10*3/uL — ABNORMAL LOW (ref 150–400)
Lab: 17 % — ABNORMAL HIGH (ref 11–15)
Lab: 2.5 M/UL — ABNORMAL LOW (ref 4.4–5.5)
Lab: 25 % — ABNORMAL LOW (ref 40–50)
Lab: 36 pg — ABNORMAL HIGH (ref 26–34)
Lab: 37 g/dL — ABNORMAL HIGH (ref 32.0–36.0)
Lab: 9.4 g/dL — ABNORMAL LOW (ref 13.5–16.5)
Lab: 9.8 FL (ref 7–11)
Lab: 98 FL (ref 80–100)

## 2019-01-30 LAB — AMPHETAMINES-URINE RANDOM: Lab: NEGATIVE

## 2019-01-30 LAB — LACTIC ACID (BG - RAPID LACTATE): Lab: 1.4 MMOL/L (ref 0.5–2.0)

## 2019-01-30 LAB — POC BLOOD GAS VEN
Lab: 20 mmHg — ABNORMAL LOW (ref 33–48)
Lab: 51 mmHg — ABNORMAL HIGH (ref 36–50)
Lab: 7.2 — ABNORMAL LOW (ref 7.30–7.40)

## 2019-01-30 LAB — POC HEMATOCRIT
Lab: 11 g/dL — ABNORMAL LOW (ref 13.5–16.5)
Lab: 33 % — ABNORMAL LOW (ref 40–50)

## 2019-01-30 LAB — POC LACTATE: Lab: 4.4 MMOL/L — ABNORMAL HIGH (ref 0.5–2.0)

## 2019-01-30 LAB — C REACTIVE PROTEIN (CRP): Lab: 0.2 mg/dL — ABNORMAL HIGH (ref <1.0)

## 2019-01-30 LAB — COCAINE-URINE RANDOM: Lab: NEGATIVE

## 2019-01-30 LAB — BENZODIAZEPINES-URINE RANDOM: Lab: NEGATIVE

## 2019-01-30 LAB — COVID-19 (SARS-COV-2) PCR

## 2019-01-30 LAB — BILIRUBIN, DIRECT: Lab: 0.2 mg/dL (ref <0.4)

## 2019-01-30 LAB — PROCALCITONIN: Lab: 0 ng/mL

## 2019-01-30 LAB — STREPTOCOCCUS PNEUMO AG, URINE

## 2019-01-30 LAB — BNP (B-TYPE NATRIURETIC PEPTI): Lab: 400 pg/mL — ABNORMAL HIGH (ref 0–100)

## 2019-01-30 LAB — URINALYSIS MICROSCOPIC REFLEX TO CULTURE

## 2019-01-30 LAB — POC CREATININE, RAD: Lab: 1 mg/dL (ref 0.4–1.24)

## 2019-01-30 LAB — POC IONIZED CALCIUM: Lab: 1.1 MMOL/L (ref 1.0–1.3)

## 2019-01-30 LAB — PHENCYCLIDINES-URINE RANDOM: Lab: NEGATIVE

## 2019-01-30 LAB — POC GLUCOSE: Lab: 173 mg/dL — ABNORMAL HIGH (ref 70–100)

## 2019-01-30 LAB — CANNABINOIDS-URINE RANDOM: Lab: NEGATIVE

## 2019-01-30 LAB — PROTIME INR (PT): Lab: 1.2 g/dL — ABNORMAL LOW (ref 0.8–1.2)

## 2019-01-30 LAB — D-DIMER: Lab: 239 ng{FEU}/mL — ABNORMAL HIGH (ref <500)

## 2019-01-30 LAB — OPIATES-URINE RANDOM: Lab: NEGATIVE

## 2019-01-30 LAB — MAGNESIUM: Lab: 1.9 mg/dL (ref 1.6–2.6)

## 2019-01-30 LAB — POC TROPONIN: Lab: 0 ng/mL (ref 0.00–0.05)

## 2019-01-30 MED ORDER — PROPOFOL 10 MG/ML IV EMUL
5-100 ug/kg/min | INTRAVENOUS | 0 refills | Status: DC
Start: 2019-01-30 — End: 2019-01-31
  Administered 2019-01-30: 20:00:00 100 ug/kg/min via INTRAVENOUS
  Administered 2019-01-31 (×3): 35 ug/kg/min via INTRAVENOUS

## 2019-01-30 MED ORDER — PANTOPRAZOLE 40 MG IV SOLR
80 mg | Freq: Once | INTRAVENOUS | 0 refills | Status: CP
Start: 2019-01-30 — End: ?
  Administered 2019-01-30: 15:00:00 80 mg via INTRAVENOUS

## 2019-01-30 MED ORDER — THIAMINE/FOLIC ACID IVPB
Freq: Once | INTRAVENOUS | 0 refills | Status: CP
Start: 2019-01-30 — End: ?
  Administered 2019-01-30 (×3): 50.000 mL via INTRAVENOUS

## 2019-01-30 MED ORDER — PANTOPRAZOLE 40 MG IV SOLR
40 mg | Freq: Two times a day (BID) | INTRAVENOUS | 0 refills | Status: DC
Start: 2019-01-30 — End: 2019-02-09
  Administered 2019-01-30 – 2019-02-09 (×20): 40 mg via INTRAVENOUS

## 2019-01-30 MED ORDER — LACTATED RINGERS IV SOLP
500 mL | Freq: Once | INTRAVENOUS | 0 refills | Status: CP
Start: 2019-01-30 — End: ?
  Administered 2019-01-31: 500 mL via INTRAVENOUS

## 2019-01-30 MED ORDER — CHLORHEXIDINE GLUCONATE 0.12 % MM MWSH
15 mL | Freq: Two times a day (BID) | 0 refills | Status: DC
Start: 2019-01-30 — End: 2019-01-31
  Administered 2019-01-31 (×2): 15 mL

## 2019-01-30 MED ORDER — LACTATED RINGERS IV SOLP
500 mL | Freq: Once | INTRAVENOUS | 0 refills | Status: CP
Start: 2019-01-30 — End: ?
  Administered 2019-01-31: 03:00:00 500 mL via INTRAVENOUS

## 2019-01-30 MED ORDER — ETOMIDATE 2 MG/ML IV SOLN
.3 mg/kg | Freq: Once | INTRAVENOUS | 0 refills | Status: CP
Start: 2019-01-30 — End: ?
  Administered 2019-01-30: 20:00:00 17.88 mg via INTRAVENOUS

## 2019-01-30 MED ORDER — LACTATED RINGERS IV SOLP
500 mL | INTRAVENOUS | 0 refills | Status: CP
Start: 2019-01-30 — End: ?

## 2019-01-30 MED ORDER — DILTIAZEM BOLUS FOR CONTINUOUS INFUSION
15 mg | Freq: Once | INTRAVENOUS | 0 refills | Status: CP
Start: 2019-01-30 — End: ?

## 2019-01-30 MED ORDER — CEFEPIME 2G/100ML NS IVPB (MB+)
2 g | Freq: Once | INTRAVENOUS | 0 refills | Status: CP
Start: 2019-01-30 — End: ?
  Administered 2019-01-30 (×2): 2 g via INTRAVENOUS

## 2019-01-30 MED ORDER — IOHEXOL 350 MG IODINE/ML IV SOLN
100 mL | Freq: Once | INTRAVENOUS | 0 refills | Status: CP
Start: 2019-01-30 — End: ?
  Administered 2019-01-30: 15:00:00 100 mL via INTRAVENOUS

## 2019-01-30 MED ORDER — DILTIAZEM 125MG/125ML NS IV DRIP
5-15 mg/h | INTRAVENOUS | 0 refills | Status: DC
Start: 2019-01-30 — End: 2019-01-30
  Administered 2019-01-30 (×2): 5 mg/h via INTRAVENOUS

## 2019-01-30 MED ORDER — LACTATED RINGERS IV SOLP
500 mL | INTRAVENOUS | 0 refills | Status: CP
Start: 2019-01-30 — End: ?
  Administered 2019-01-31: 04:00:00 500 mL via INTRAVENOUS

## 2019-01-30 MED ORDER — NALOXONE 0.4 MG/ML IJ SOLN
.08 mg | INTRAVENOUS | 0 refills | Status: DC | PRN
Start: 2019-01-30 — End: 2019-01-31

## 2019-01-30 MED ORDER — METRONIDAZOLE IN NACL (ISO-OS) 500 MG/100 ML IV PGBK
500 mg | INTRAVENOUS | 0 refills | Status: DC
Start: 2019-01-30 — End: 2019-01-31
  Administered 2019-01-31 (×2): 500 mg via INTRAVENOUS

## 2019-01-30 MED ORDER — FENTANYL DRIP IN NS 1000MCG/100ML
10-100 ug/h | INTRAVENOUS | 0 refills | Status: DC
Start: 2019-01-30 — End: 2019-01-31
  Administered 2019-01-30: 22:00:00 10 ug/h via INTRAVENOUS
  Administered 2019-01-31: 14:00:00 30 ug/h via INTRAVENOUS

## 2019-01-30 MED ORDER — SODIUM CHLORIDE 0.9 % IJ SOLN
50 mL | Freq: Once | INTRAVENOUS | 0 refills | Status: CP
Start: 2019-01-30 — End: ?
  Administered 2019-01-30: 15:00:00 50 mL via INTRAVENOUS

## 2019-01-30 MED ORDER — NOREPINEPHRINE BITARTRATE-NACL 4 MG/250 ML (16 MCG/ML) IV SOLN
0-.5 ug/kg/min | INTRAVENOUS | 0 refills | Status: DC
Start: 2019-01-30 — End: 2019-01-31
  Administered 2019-01-31: 0.02 ug/kg/min via INTRAVENOUS

## 2019-01-30 MED ORDER — ROCURONIUM 10 MG/ML IV SOLN
.6 mg/kg | Freq: Once | INTRAVENOUS | 0 refills | Status: CP
Start: 2019-01-30 — End: ?
  Administered 2019-01-30: 20:00:00 35.8 mg via INTRAVENOUS

## 2019-01-30 MED ORDER — THIAMINE/FOLIC ACID IVPB
Freq: Every day | INTRAVENOUS | 0 refills | Status: DC
Start: 2019-01-30 — End: 2019-02-13
  Administered 2019-01-31 – 2019-02-13 (×39): 50.000 mL via INTRAVENOUS

## 2019-01-30 MED ORDER — FENTANYL CITRATE (PF) 50 MCG/ML IJ SOLN
50 ug | Freq: Once | INTRAVENOUS | 0 refills | Status: CP
Start: 2019-01-30 — End: ?
  Administered 2019-01-30: 18:00:00 50 ug via INTRAVENOUS

## 2019-01-30 MED ORDER — METRONIDAZOLE IN NACL (ISO-OS) 500 MG/100 ML IV PGBK
500 mg | Freq: Once | INTRAVENOUS | 0 refills | Status: CP
Start: 2019-01-30 — End: ?
  Administered 2019-01-30: 17:00:00 500 mg via INTRAVENOUS

## 2019-01-30 MED ADMIN — LACTATED RINGERS IV SOLP [4318]: 500 mL | INTRAVENOUS | @ 15:00:00 | Stop: 2019-01-30 | NDC 00338011703

## 2019-01-30 NOTE — Procedures
Bronchoscopy Procedure Note    Indications: The patient is an 68 y.o M admitted for hemoptysis.       Procedure: Bronchoscopy     Bronchoscopist: Melinda Crutch, MD     Anesthesia: General endotracheal anesthesia    ASA Class: 3    Procedure Details   The bronchoscope was passed with ease through   ET tube and advanced to the trachea, right bronchial tree, and   left bronchial tree. The views were excellent. The patient's toleration of   the procedure was excellent.      Findings:   The trachea appeared to be normal. There was as copious amount of fresh blood noted in the trachea.   An airway inspection was performed in the area. The right bronchial tree   appeared to be normal. There was a copious amount of blood noted in the right mainstem bronchus.  The left bronchial tree had a copious amount of fresh blood. A bronchoalveolar lavage was performed from the LUL. A total of 150 mils were instilled and 60 mls were returned. The return was bloody. There was a endobronchial lesion noted in the superior segment of the LLL. It was difficult to differentiate if this was a lesion versus mucous plug verus a bloody airway clot.     All specimens were submitted for cytology, pathology and/or for culture. The patient tolerated the procedure well with no apparent complications An airway inspection was     Plan for repeat bronchoscopy in the next 24 to 48 hours for airway inspection and further evaluation of the LLL.       Estimated Blood Loss:  less than 50 ml                    Specimens: cell count, cultures, cytology                     Complications:  None; patient tolerated the procedure well.           Disposition: ICU - intubated and hemodynamically stable.           Condition: stable

## 2019-01-30 NOTE — Consults
[facility] Consult     Patient: Alexis Weeks, [id number]  Admission Date:  01/30/2019, LOS: 0 days  Admission Diagnosis: Hemoptysis [R04.2]  Date of Service: January 30, 2019  Consults   ASSESSMENT:   Alexis Weeks is a 68 y.o. male with PMH of Afib, HTN, GI bleed, COPD, Hepatitis, pancreatitis consulted to [facility] for hematemesis and CT showing emphysematous gastritis    PLAN:  - CT Abd/Pelv shows concern for development of emphysematous gastritis, primarily involving the gastric cardia and fundus  - Clinically patients abdominal exam is tender to right umbilical region, however not an acute abdomen concerning for peritonitis  - No acute surgical intervention at this time    - Will do serial abdominal exams today  - Continue to monitor patient    Seen and discussed with staff surgeon, Dr. Merleen Milliner, who directed plan of care    Crist Infante, MD  Service Pager: [phone]  _____________________________________________________________________________    HPI: Alexis Weeks is a 68 y.o. male with PMH of afib, HTN, GI bleed, COPD, Hepatitis, pancreatitis came to the emergency department for hemoptysis. Patient reports that he devolved severe hemoptysis leading to hematemesis starting this morning that was significant enough to have difficulty breathing. He states that he does having these coughing of blood in the past however it has never been this bad. He says his last episode was 3 months ago. He also states that he had an episode of incontinence during this episode. Denies any blood in his stools. He clams to have some right umbilical pain that has been worsening over the last few days.      Medical History:   Diagnosis Date   ? A-fib (HCC)    ? Chest pain     with activity   ? COPD (chronic obstructive pulmonary disease) (HCC)    ? Hepatitis     C, treated with pills    ? History of GI bleed 12/01/2017 At [facility] in June, 2019.  Upper GI endoscopy did not identify a cause   ? Hypertension      Surgical History:   Procedure Laterality Date   ? SPINE SURGERY  2004   ? HX TURP  2013   ? HERNIA REPAIR  2014    Ventral Hernia   ? ESOPHAGOGASTRODUODENOSCOPY ENDOSCOPIC ULTRASOUND N/A 04/01/2017    Performed by Vertell Novak, MD at [facility] ENDO   ? BRONCHOSCOPY DIAGNOSTIC WITH/ WITHOUT CELL WASHING - FLEXIBLE N/A 09/12/2017    Performed by Cloretta Ned, MD at [facility] OR   ? BRONCHOSCOPY WITH BRONCHIAL ALVEOLAR LAVAGE - FLEXIBLE N/A 09/12/2017    Performed by Cloretta Ned, MD at [facility] OR   ? PERCUTANEOUS CLOSURE LEFT ATRIAL APPENDAGE WITH ENDOCARDIAL IMPLANT, TRANSEPTAL CATHETERIZATION, FOLEY, CONTRAST ANTICIPATED N/A 12/26/2017    Performed by Deniece Ree, MD at [facility] CVOR   ? TRANSESOPHAGEAL ECHOCARDIOGRAM DURING INTERVENTION N/A 12/26/2017    Performed by Vena Austria, MD at [facility] CVOR   ? ESOPHAGOGASTRODUODENOSCOPY WITH SPECIMEN COLLECTION BY BRUSHING/ WASHING N/A 02/21/2018    Performed by Samuel Jester, MD at [facility] ENDO   ? BRONCHOSCOPY WITH BRONCHIAL/ ENDOBRONCHIAL BIOPSY - FLEXIBLE N/A 04/24/2018    Performed by Audie Box, MD at [facility] OR   ? BRONCHOSCOPY WITH BRONCHIAL ALVEOLAR LAVAGE - FLEXIBLE  04/24/2018    Performed by Audie Box, MD at [facility] OR   ? CYSTOURETHROSCOPY WITH FULGURATION/ RESECTION BLADDER TUMOR N/A 05/22/2018    Performed by Ladona Ridgel,  Mayra Neer, MD at [facility] OR   ? ESOPHAGOGASTRODUODENOSCOPY WITH ENDOSCOPIC ULTRASOUND EXAMINATION - FLEXIBLE N/A 10/20/2018    Performed by Vertell Novak, MD at [facility] ENDO   ? ENDOSCOPIC RETROGRADE CHOLANGIOPANCREATOGRAPHY N/A 11/03/2018    Performed by Vertell Novak, MD at [facility] ENDO   ? ENDOSCOPIC RETROGRADE CHOLANGIOPANCREATOGRAPHY N/A 12/15/2018    Performed by Vertell Novak, MD at [facility] ENDO   ? ENDOSCOPIC RETROGRADE CHOLANGIOPANCREATOGRAPHY WITH REMOVAL FOREIGN BODY/ STENT FROM BILIARY/ PANCREATIC DUCT  12/15/2018    Performed by Vertell Novak, MD at [facility] ENDO ? FRACTURE SURGERY  1970s    arm   ? HX CHOLECYSTECTOMY  1970s   ? INGUINAL HERNIA REPAIR Bilateral 1950s     Family History   Problem Relation Age of Onset   ? Arthritis Mother    ? Hearing Loss Mother    ? Alcohol abuse Father    ? COPD Sister    ? Alcohol abuse Brother      Social History     Tobacco Use   ? Smoking status: Current Every Day Smoker     Packs/day: 0.50     Years: 51.00     Pack years: 25.50     Types: Cigarettes   ? Smokeless tobacco: Never Used   Substance Use Topics   ? Alcohol use: Yes     Alcohol/week: 6.0 standard drinks     Types: 6 Cans of beer per week     Frequency: Never     Your Current Medications:       Instructions    albuterol sulfate (PROAIR HFA) 90 mcg/actuation HFA aerosol inhaler Inhale one puff to two puffs by mouth into the lungs every 6 hours as needed for Wheezing or Shortness of Breath. Shake well before use.    aspirin 81 mg chewable tablet Chew one tablet by mouth daily. Take with food.    azithromycin (ZITHROMAX) 250 mg tablet Take two tablets by mouth three times weekly.    cholecalciferol (VITAMIN D3) 2,000 unit tablet Take 2,000 Units by mouth daily.    cyclobenzaprine (FLEXERIL) 10 mg tablet Take 10 mg by mouth three times daily as needed for Muscle Cramps.    diltiazem CD (CARDIZEM CD) 360 mg capsule Take one capsule by mouth daily.    docusate (COLACE) 100 mg capsule Take 100 mg by mouth daily as needed for Constipation.    gabapentin (NEURONTIN) 300 mg capsule Take 300 mg by mouth every 8 hours as needed.    lipase-protease-amylase (PANCREAZE) 21,000-54,700- 83,900 units capsule Take one capsule by mouth as directed. Take 3 tablets with every meal and 2 tablets with everysnack    lisinopriL (ZESTRIL) 10 mg tablet Take 10 mg by mouth daily.    metoprolol tartrate (LOPRESSOR) 100 mg tablet Take 100 mg by mouth twice daily.    oxybutynin chloride (DITROPAN) 5 mg tablet Take one tablet by mouth three times daily as needed. oxycodone (ROXICODONE) 20 mg tablet Take 20 mg by mouth every 6 hours as needed      umeclidinium-vilanteroL (ANORO ELLIPTA) 62.5-25 mcg/actuation inhaler Inhale one puff by mouth into the lungs daily.          Review of Systems   Constitutional: Negative for chills, fever and malaise/fatigue.   HENT: Negative.    Eyes: Negative for blurred vision and double vision.   Respiratory: Positive for cough, hemoptysis and shortness of breath.    Cardiovascular: Negative for chest pain, palpitations and orthopnea.   Gastrointestinal: Positive  for abdominal pain and vomiting. Negative for blood in stool, constipation, diarrhea and nausea.   Genitourinary: Negative for dysuria, frequency, hematuria and urgency.   Musculoskeletal: Negative for myalgias.   Skin: Negative.    Neurological: Negative for dizziness, tingling, sensory change, weakness and headaches.   Endo/Heme/Allergies: Negative.    Psychiatric/Behavioral: Negative.        Vitals:  BP: (123-197)/(79-129)   Temp:  [36.4 ?C (97.6 ?F)]   Pulse:  [104-173]   Respirations:  [21 PER MINUTE-45 PER MINUTE]   SpO2:  [84 %-100 %]     Physical Exam  Constitutional:       General: He is not in acute distress.  HENT:      Head: Normocephalic and atraumatic.   Eyes:      Extraocular Movements: Extraocular movements intact.   Cardiovascular:      Rate and Rhythm: Normal rate and regular rhythm.   Pulmonary:      Effort: Pulmonary effort is normal. No respiratory distress.   Abdominal:      General: There is no distension.      Palpations: Abdomen is soft.      Tenderness: There is no guarding or rebound.      Hernia: No hernia is present.      Comments: Right periumbilical tenderness to palpation    Musculoskeletal: Normal range of motion.   Skin:     General: Skin is warm and dry.   Neurological:      General: No focal deficit present.      Mental Status: He is alert and oriented to person, place, and time.         Lab/Radiology/Other Diagnostic Tests:  Lab Results Component Value Date/Time    NA 138 01/30/2019 08:53 AM    K 3.9 01/30/2019 08:53 AM    CL 106 01/30/2019 08:53 AM    CO2 20 (L) 01/30/2019 08:53 AM    BUN 13 01/30/2019 08:53 AM    CR 1.0 01/30/2019 08:56 AM    CR 1.15 01/30/2019 08:53 AM    MG 1.9 01/30/2019 08:53 AM    PO4 4.2 01/30/2019 08:53 AM        Lab Results   Component Value Date/Time    HGB 11.0 (L) 01/30/2019 08:53 AM    HCT 32.5 (L) 01/30/2019 08:53 AM    WBC 8.6 01/30/2019 08:53 AM    PLTCT 168 01/30/2019 08:53 AM    INR 1.2 01/30/2019 08:53 AM     Lab Results   Component Value Date/Time    GLUPOC 173 (H) 01/30/2019 08:46 AM            CTA CHEST WO/W CONTRAST+POST IMPRESSION   Final Result         CHEST:   1.  Evaluation of the distal pulmonary arteries is limited secondary to respiratory motion, though, there is no central or segmental pulmonary embolism.      2.  Development of left upper lobe alveolar consolidation and surrounding groundglass opacities adjacent to previously visualized impacted posterior left upper lobe bronchus. This either represents pneumonia and/or alveolar hemorrhage.      3.  Development of multifocal pneumonia/pneumonitis throughout the right lung with associated bronchial wall thickening and endobronchial debris.       4.  Development of small bilateral pleural effusions.      5.  Mild cardiomegaly with coronary artery calcification.      6.  Mild emphysema.      ABDOMEN AND  PELVIS:   1.  Development of emphysematous gastritis, primarily involving the gastric cardia and fundus   2.  Redemonstration of chronic pancreatitis with development of subtle peripancreatic tail fat stranding, with small obstructing stone within the distal main pancreatic duct with minimal upstream ductal dilatation. These findings are suggestive of acute interstitial edematous pancreatitis.   3.  Unchanged aneurysmal dilatation of infrarenal aorta    4.  Unchanged minimal ascites Dr. Windell Moulding discussed these findings with Dr. Neil Crouch by telephone at 9:48 AM11/17/2020 after consultation with Dr. Fara Boros.      By my electronic signature, I attest that I have personally reviewed the images for this examination and formulated the interpretations and opinions expressed in this report          Finalized by Rosilyn Mings, M.D. on 01/30/2019 10:44 AM. Dictated by Gilmer Mor, MD on 01/30/2019 9:27 AM.         CTA ABD/PELVIS   Final Result         CHEST:   1.  Evaluation of the distal pulmonary arteries is limited secondary to respiratory motion, though, there is no central or segmental pulmonary embolism.      2.  Development of left upper lobe alveolar consolidation and surrounding groundglass opacities adjacent to previously visualized impacted posterior left upper lobe bronchus. This either represents pneumonia and/or alveolar hemorrhage.      3.  Development of multifocal pneumonia/pneumonitis throughout the right lung with associated bronchial wall thickening and endobronchial debris.       4.  Development of small bilateral pleural effusions.      5.  Mild cardiomegaly with coronary artery calcification.      6.  Mild emphysema.      ABDOMEN AND PELVIS:   1.  Development of emphysematous gastritis, primarily involving the gastric cardia and fundus   2.  Redemonstration of chronic pancreatitis with development of subtle peripancreatic tail fat stranding, with small obstructing stone within the distal main pancreatic duct with minimal upstream ductal dilatation. These findings are suggestive of acute interstitial edematous pancreatitis.   3.  Unchanged aneurysmal dilatation of infrarenal aorta    4.  Unchanged minimal ascites      Dr. Windell Moulding discussed these findings with Dr. Neil Crouch by telephone at 9:48 AM11/17/2020 after consultation with Dr. Fara Boros. By my electronic signature, I attest that I have personally reviewed the images for this examination and formulated the interpretations and opinions expressed in this report          Finalized by Rosilyn Mings, M.D. on 01/30/2019 10:44 AM. Dictated by Gilmer Mor, MD on 01/30/2019 9:27 AM.         CHEST SINGLE VIEW   Final Result         Mildly enlarged cardiac silhouette with indistinct pulmonary vasculature, likely edema.      Patchy perihilar bibasilar opacities, likely edema and/or atelectasis, however pneumonia or hemorrhage are additional possibilities.    Finalized by Francis Dowse, M.D. on 01/30/2019 9:16 AM. Dictated by Francis Dowse, M.D. on 01/30/2019 9:12 AM.         POC ED [place] CARDIAC LIMITED    (Results Pending)   POC MICU [place] CARDIAC LIMITED    (Results Pending)

## 2019-01-30 NOTE — Progress Notes
MICU STAFF NOTE      I have seen, examined, and personally fully evaluated the patient, who is critically ill with the conditions listed below:    Principal Problem:    Hemoptysis  Active Problems:    Essential hypertension    Chronic atrial fibrillation    History of GI bleed    COPD (chronic obstructive pulmonary disease) (HCC)      I spent 85 minutes (excluding time spent performing or supervising any procedures) providing and personally directing critical care services including:    ? Systems review and physical examination  ? Serial evaluation and management of hemodynamics  ? Serial evaluation and management of respiratory status  ? Review and management of ICU prophylaxis and core measures  ? Review of laboratory data  ? Review of telemetry data  ? Review of imaging studies  ? Review of medications  ? Fluid and electrolyte management    Exam:    Gen: Alert and orientated x 4, on 12 L oxygen   HEENT: op dry, ac/nt  CV:  RRR, no m/r/g  Lungs:CTAB  Abd: soft, nt/nd  Ext: no c/c/e  Skin: no rashes  Neuro: non-focal     Impression:  1. Acute hypoxemic respiratory failure  2. Hemoptysis-CT chest with no VTE  3. COPD  4. Hx of Atrial fibrillation with RVR  5. Emphysematous gastritis   6. Hx of tobacco abuse  7. Hep C       Plan:  -patient with recurrent hemoptysis.  -admitted with acute hypoxemic respiratory failure and hemoptysis > 300 to 400 mls.   -patient intubated for airway inspection and immediately underwent a bronchoscopy. Please refer to separate bronch note for details.   -continue LTVV.   -monitor serial Hgb  -if the patient develops massive hemoptysis, may consider endobronchial balloon blocker versus BAE  -prior auto-immune workup negative (anca's, ANA, PR3/MPO, and anti-GBM). Hold on repeating   -patient initiated on diltiazem gtt for atrial fibrillation   -continue supportive and ICU care.       Staff name:  Dionne Ano, MD Date:  01/30/2019     Pulmonary/Critical Care/Sleep Medicine Available on [facility]   Pager [phone]

## 2019-01-30 NOTE — Other
Procedure Note    Alexis Weeks is a 68 y.o. male.        Procedure: Airway Placement    Intubation    Date/Time: 01/30/2019 2:17 PM    Patient location: ICU  Urgency: elective  Difficult Airway: No      Pre-Intubation Evaluation  Mallampati: I  Neck ROM: full  TM distance: >3 FB  Mouth opening: good  Airway patency: adequate    Dental findings: edentulous    The procedure was performed in an emergent situation.  Written consent obtained.  Verbal consent not obtained.  Consent given by patient.  Risks and benefits discussed: no    Airway Procedure  Indication(s) for airway management: airway protection and respiratory failure    Sedation medication: etomidate  Paralytics given: rocuronium      Preoxygenated: yes  Patient position: ramp  Neck stabilization: in-line stabilization    Mask difficulty assessment: 0 - not attempted      Procedure Outcome  Final airway type: endotracheal airway  Endotracheal airway: ETT      ETT size (mm): 8.0;   Technique used for successful ETT placement: video laryngoscopy    Laryngoscope/Videolaryngoscope blade size: 4  Cormack-Lehane classification: grade I - full view of glottis  Cuffed: yes    Measured from: lips  Number of attempts at approach: 1        Complications  Cardiovascular:  Pulmonary:  Procedure:airway not difficult,  Medication:        Performed by: Melinda Crutch, MD  Authorized by: Melinda Crutch, MD    Refer to nursing documentation for vitals/monitoring data            Melinda Crutch, MD

## 2019-01-30 NOTE — Progress Notes
Patient arrived to room # (2661) via cart accompanied by RN. Patient transferred to the bed with assistance. Bedside safety checks completed. Initial patient assessment completed. Refer to flowsheet for details.    Admission skin assessment completed with: Daleen Bo, RN    Pressure injury present on arrival?: No    1. Head/Face/Neck: No  2. Trunk/Back: No  3. Upper Extremities: No  4. Lower Extremities: No  5. Pelvic/Coccyx: No  6. Assessed for device associated injury? Yes  7. Malnutrition Screening Tool (Nursing Nutrition Assessment) Completed? Yes    See Doc Flowsheet for additional wound details.     INTERVENTIONS:

## 2019-01-31 ENCOUNTER — Encounter: Admit: 2019-01-31 | Discharge: 2019-01-31

## 2019-01-31 ENCOUNTER — Inpatient Hospital Stay: Admit: 2019-01-31 | Discharge: 2019-01-31

## 2019-01-31 DIAGNOSIS — Z8719 Personal history of other diseases of the digestive system: Secondary | ICD-10-CM

## 2019-01-31 DIAGNOSIS — I4891 Unspecified atrial fibrillation: Secondary | ICD-10-CM

## 2019-01-31 DIAGNOSIS — I10 Essential (primary) hypertension: Secondary | ICD-10-CM

## 2019-01-31 DIAGNOSIS — J449 Chronic obstructive pulmonary disease, unspecified: Secondary | ICD-10-CM

## 2019-01-31 DIAGNOSIS — R079 Chest pain, unspecified: Secondary | ICD-10-CM

## 2019-01-31 DIAGNOSIS — K759 Inflammatory liver disease, unspecified: Secondary | ICD-10-CM

## 2019-01-31 LAB — PHENCYCLIDINES-URINE RANDOM: Lab: NEGATIVE

## 2019-01-31 LAB — CANNABINOIDS-URINE RANDOM: Lab: POSITIVE — AB

## 2019-01-31 LAB — CBC
Lab: 122 10*3/uL — ABNORMAL LOW (ref 150–400)
Lab: 17 % — ABNORMAL HIGH (ref 11–15)
Lab: 2.4 M/UL — ABNORMAL LOW (ref 4.4–5.5)
Lab: 24 % — ABNORMAL LOW (ref 40–50)
Lab: 32 pg (ref 26–34)
Lab: 33 g/dL (ref 32.0–36.0)
Lab: 8.1 10*3/uL (ref 4.5–11.0)
Lab: 8.1 g/dL — ABNORMAL LOW (ref 13.5–16.5)
Lab: 8.9 FL (ref 7–11)
Lab: 97 FL (ref 80–100)

## 2019-01-31 LAB — BLOOD GASES, PERIPHERAL VENOUS
Lab: 1.6 MMOL/L — ABNORMAL HIGH (ref <20.7)
Lab: 22 MMOL/L
Lab: 35 mmHg (ref 33–48)
Lab: 51 mmHg — ABNORMAL HIGH (ref 36–50)
Lab: 56 % (ref 55–71)
Lab: 7.3 mL/min (ref >60)

## 2019-01-31 LAB — GRAM STAIN

## 2019-01-31 LAB — OPIATES-URINE RANDOM: Lab: NEGATIVE

## 2019-01-31 LAB — BENZODIAZEPINES-URINE RANDOM: Lab: NEGATIVE

## 2019-01-31 LAB — BARBITURATES-URINE RANDOM: Lab: NEGATIVE

## 2019-01-31 LAB — COCAINE-URINE RANDOM: Lab: NEGATIVE

## 2019-01-31 LAB — C REACTIVE PROTEIN (CRP): Lab: 0.5 mg/dL (ref <1.0)

## 2019-01-31 LAB — AMPHETAMINES-URINE RANDOM: Lab: NEGATIVE

## 2019-01-31 LAB — MRSA PNEUMONIA SCREEN

## 2019-01-31 LAB — SED RATE: Lab: <1 mm/h (ref 0–20)

## 2019-01-31 MED ORDER — PHENYLEPHRINE IN 0.9% NACL(PF) 1 MG/10 ML (100 MCG/ML) IV SYRG
INTRAVENOUS | 0 refills | Status: DC
Start: 2019-01-31 — End: 2019-02-01
  Administered 2019-02-01: 500 ug via INTRAVENOUS

## 2019-01-31 MED ORDER — METOPROLOL TARTRATE 5 MG/5 ML IV SOLN
5 mg | Freq: Once | INTRAVENOUS | 0 refills | Status: CP
Start: 2019-01-31 — End: ?
  Administered 2019-01-31: 19:00:00 5 mg via INTRAVENOUS

## 2019-01-31 MED ORDER — PROPOFOL INJ 10 MG/ML IV VIAL
0 refills | Status: DC
Start: 2019-01-31 — End: 2019-02-01
  Administered 2019-01-31: 23:00:00 100 mg via INTRAVENOUS
  Administered 2019-02-01: 01:00:00 50 mg via INTRAVENOUS
  Administered 2019-02-01 (×2): 20 mg via INTRAVENOUS

## 2019-01-31 MED ORDER — MIDAZOLAM 1 MG/ML IJ SOLN
INTRAVENOUS | 0 refills | Status: DC
Start: 2019-01-31 — End: 2019-02-01
  Administered 2019-02-01: 01:00:00 2 mg via INTRAVENOUS

## 2019-01-31 MED ORDER — CALCIUM CHLORIDE 100 MG/ML (10 %) IV SYRG
0 refills | Status: DC
Start: 2019-01-31 — End: 2019-02-01
  Administered 2019-02-01 (×2): 0.5 g via INTRAVENOUS

## 2019-01-31 MED ORDER — ROCURONIUM 10 MG/ML IV SOLN
INTRAVENOUS | 0 refills | Status: DC
Start: 2019-01-31 — End: 2019-02-01
  Administered 2019-01-31 – 2019-02-01 (×2): 50 mg via INTRAVENOUS

## 2019-01-31 MED ORDER — FENTANYL CITRATE (PF) 50 MCG/ML IJ SOLN
50 ug | Freq: Once | INTRAVENOUS | 0 refills | Status: CP
Start: 2019-01-31 — End: ?

## 2019-01-31 MED ORDER — PROPOFOL 10 MG/ML IV EMUL 100 ML (INFUSION)(AM)(OR)
0 refills | Status: DC
Start: 2019-01-31 — End: 2019-02-01
  Administered 2019-01-31: 100 ug/kg/min via INTRAVENOUS

## 2019-01-31 MED ORDER — MAGNESIUM SULFATE IN D5W 1 GRAM/100 ML IV PGBK
1 g | Freq: Once | INTRAVENOUS | 0 refills | Status: CP
Start: 2019-01-31 — End: ?
  Administered 2019-01-31: 14:00:00 1 g via INTRAVENOUS

## 2019-01-31 MED ORDER — TETRACAINE(#) 0.25%/EPINEPHRINE 0.003% IJ SOLN
0 refills | Status: DC
Start: 2019-01-31 — End: 2019-02-01
  Administered 2019-01-31: 9 mL via RESPIRATORY_TRACT
  Administered 2019-01-31: 23:00:00 6 mL via RESPIRATORY_TRACT
  Administered 2019-01-31: 23:00:00 9 mL via RESPIRATORY_TRACT

## 2019-01-31 MED ORDER — TRANEXAMIC ACID 1,000 MG/10 ML (100 MG/ML) IV SOLN
0 refills | Status: DC
Start: 2019-01-31 — End: 2019-02-01
  Administered 2019-01-31: 23:00:00 2 mL via TOPICAL
  Administered 2019-01-31: 23:00:00 3 mL via TOPICAL

## 2019-01-31 MED ORDER — PROPOFOL 10 MG/ML IV EMUL
5-50 ug/kg/min | INTRAVENOUS | 0 refills | Status: DC
Start: 2019-01-31 — End: 2019-02-06
  Administered 2019-02-01: 17:00:00 45 ug/kg/min via INTRAVENOUS
  Administered 2019-02-01: 02:00:00 30 ug/kg/min via INTRAVENOUS
  Administered 2019-02-01: 12:00:00 45 ug/kg/min via INTRAVENOUS
  Administered 2019-02-01: 06:00:00 40 ug/kg/min via INTRAVENOUS
  Administered 2019-02-01: 22:00:00 45 ug/kg/min via INTRAVENOUS
  Administered 2019-02-02: 03:00:00 55 ug/kg/min via INTRAVENOUS
  Administered 2019-02-02: 14:00:00 40 ug/kg/min via INTRAVENOUS
  Administered 2019-02-02 – 2019-02-03 (×2): 60 ug/kg/min via INTRAVENOUS
  Administered 2019-02-05 (×2): 35 ug/kg/min via INTRAVENOUS
  Administered 2019-02-05 – 2019-02-06 (×2): 30 ug/kg/min via INTRAVENOUS
  Administered 2019-02-06: 03:00:00 50 ug/kg/min via INTRAVENOUS

## 2019-01-31 MED ORDER — LIDOCAINE (PF) 200 MG/10 ML (2 %) IJ SYRG
0 refills | Status: DC
Start: 2019-01-31 — End: 2019-02-01
  Administered 2019-01-31: 23:00:00 100 mg via INTRAVENOUS

## 2019-01-31 MED ORDER — FENTANYL CITRATE (PF) 50 MCG/ML IJ SOLN
0 refills | Status: DC
Start: 2019-01-31 — End: 2019-02-01
  Administered 2019-02-01 (×3): 50 ug via INTRAVENOUS
  Administered 2019-02-01: 01:00:00 100 ug via INTRAVENOUS

## 2019-01-31 MED ORDER — LIDOCAINE 5 % TP PTMD
1 | Freq: Every day | TOPICAL | 0 refills | Status: DC
Start: 2019-01-31 — End: 2019-02-06

## 2019-01-31 MED ORDER — PHYTONADIONE (VITAMIN K1) 5 MG PO TAB
5 mg | Freq: Once | NASOGASTRIC | 0 refills | Status: CP
Start: 2019-01-31 — End: ?
  Administered 2019-02-01: 06:00:00 5 mg via NASOGASTRIC

## 2019-01-31 MED ORDER — FENTANYL DRIP IN NS 1000MCG/100ML
10-100 ug/h | INTRAVENOUS | 0 refills | Status: DC
Start: 2019-01-31 — End: 2019-02-06
  Administered 2019-02-01: 03:00:00 20 ug/h via INTRAVENOUS
  Administered 2019-02-01: 16:00:00 45 ug/h via INTRAVENOUS
  Administered 2019-02-02 (×2): 50 ug/h via INTRAVENOUS
  Administered 2019-02-03: 23:00:00 100 ug/h via INTRAVENOUS
  Administered 2019-02-03: 14:00:00 50 ug/h via INTRAVENOUS
  Administered 2019-02-04 (×2): 100 ug/h via INTRAVENOUS
  Administered 2019-02-05: 01:00:00 30 ug/h via INTRAVENOUS
  Administered 2019-02-06: 05:00:00 40 ug/h via INTRAVENOUS

## 2019-01-31 MED ORDER — SODIUM BICARBONATE 1 MEQ/ML (8.4 %) IV SOLN
0 refills | Status: DC
Start: 2019-01-31 — End: 2019-02-01
  Administered 2019-02-01: 02:00:00 30 meq via INTRAVENOUS
  Administered 2019-02-01: 01:00:00 20 meq via INTRAVENOUS

## 2019-01-31 MED ORDER — NOREPINEPHRINE IV DRIP STD CONC (AM)(OR)
0 refills | Status: DC
Start: 2019-01-31 — End: 2019-02-01
  Administered 2019-02-01: .1 ug/kg/min via INTRAVENOUS

## 2019-01-31 MED ORDER — SODIUM CHLORIDE 0.9 % IV SOLP
1000 mL | 0 refills | Status: AC
Start: 2019-01-31 — End: ?
  Administered 2019-02-01: 03:00:00 1000 mL

## 2019-01-31 MED ORDER — METOPROLOL TARTRATE 5 MG/5 ML IV SOLN
5 mg | Freq: Once | INTRAVENOUS | 0 refills | Status: CP
Start: 2019-01-31 — End: ?
  Administered 2019-02-01: 03:00:00 5 mg via INTRAVENOUS

## 2019-01-31 MED ORDER — IOHEXOL 300 MG IODINE/ML IV SOLN
150 mL | Freq: Once | INTRA_ARTERIAL | 0 refills | Status: CP
Start: 2019-01-31 — End: ?
  Administered 2019-02-01: 02:00:00 150 mL via INTRA_ARTERIAL

## 2019-01-31 MED ADMIN — FENTANYL CITRATE (PF) 50 MCG/ML IJ SOLN [3037]: 50 ug | INTRAVENOUS | @ 19:00:00 | Stop: 2019-01-31 | NDC 00409909412

## 2019-01-31 NOTE — Anesthesia Procedure Notes
Anesthesia Procedure: Arterial Line Placement    A-LINE INSERTION    Date/Time: 01/31/2019 5:41 PM    Patient location: OR  Indications: hemodynamic monitoring      Preprocedure checklist performed: 2 patient identifiers, risks & benefits discussed, patient evaluated, timeout performed, consent obtained and patient being monitored    Sterile technique:  - Proper hand washing  - Cap, mask  - Sterile gloves  - Skin prep for antisepsis        Arterial Line Procedure   Patient sedated: yes (see MAR)  Sedation type: general;   Artery prepped with chlorhexidine; skin prep agent completely dried prior to procedure.  Location: radial artery  Laterality: left  Technique: palpation  Needle gauge: 20 G    Procedure Outcome  Catheter secured with adhesive dressing applied  Events: no complications noted during insertion and skin intact, warm, and dry    Observation: pt tolerated well        Performed by: Siscel, [place], MD  Authorized by: Siscel, [place], MD

## 2019-01-31 NOTE — Anesthesia Pre-Procedure Evaluation
Anesthesia Pre-Procedure Evaluation    Name: Alexis Weeks      MRN: [id number]     DOB: Jan 26, 1951     Age: 68 y.o.     Sex: male   _________________________________________________________________________     Procedure Info:   Procedure Information     Date/Time: 01/31/19 1700    Procedure: BRONCHOSCOPY DIAGNOSTIC WITH CELL WASHING - FLEXIBLE (Bilateral )    Location: MAIN OR 18 / Main OR/Periop    Surgeon: Dionne Ano, MD          Physical Assessment  Vital Signs (last filed in past 24 hours):  BP: 149/82 (11/18 1312)  Temp: 37.1 ?C (98.8 ?F) (11/18 0400)  Pulse: 126 (11/18 1312)  Respirations: 15 PER MINUTE (11/18 1300)  SpO2: 95 % (11/18 1300)      Patient History   Allergies   Allergen Reactions   ? Penicillins HIVES and EDEMA        Current Medications    Medication Directions   albuterol sulfate (PROAIR HFA) 90 mcg/actuation HFA aerosol inhaler Inhale one puff to two puffs by mouth into the lungs every 6 hours as needed for Wheezing or Shortness of Breath. Shake well before use.   aspirin 81 mg chewable tablet Chew one tablet by mouth daily. Take with food.   azithromycin (ZITHROMAX) 250 mg tablet Take two tablets by mouth three times weekly.   cholecalciferol (VITAMIN D3) 2,000 unit tablet Take 2,000 Units by mouth daily.   cyclobenzaprine (FLEXERIL) 10 mg tablet Take 10 mg by mouth three times daily as needed for Muscle Cramps.   diltiazem CD (CARDIZEM CD) 360 mg capsule Take one capsule by mouth daily.   docusate (COLACE) 100 mg capsule Take 100 mg by mouth daily as needed for Constipation.   gabapentin (NEURONTIN) 300 mg capsule Take 300 mg by mouth every 8 hours as needed.   lipase-protease-amylase (PANCREAZE) 21,000-54,700- 83,900 units capsule Take one capsule by mouth as directed. Take 3 tablets with every meal and 2 tablets with everysnack   lisinopriL (ZESTRIL) 10 mg tablet Take 10 mg by mouth daily.   metoprolol tartrate (LOPRESSOR) 100 mg tablet Take 100 mg by mouth twice daily. oxybutynin chloride (DITROPAN) 5 mg tablet Take one tablet by mouth three times daily as needed.   oxycodone (ROXICODONE) 20 mg tablet Take 20 mg by mouth every 6 hours as needed     umeclidinium-vilanteroL (ANORO ELLIPTA) 62.5-25 mcg/actuation inhaler Inhale one puff by mouth into the lungs daily.         Review of Systems/Medical History      Patient summary reviewed  Pertinent labs reviewed    No history of anesthetic complications  No family history of anesthetic complications      Airway         Hemoptysis. Currently intubated and sedated      Pulmonary       Current smoker      COPD      Hx of Bronchiectasis  Hematemesis and CT showing emphysematous gastritis  Current acute respiratory failure requiring intubation.    PFT 12/2017:  FVC-Pre    3.14   FVC-%Pred-pre    65   FVC-Post    2.89  FVC-%Pred-Post: 60         Cardiovascular       Recent diagnostic studies:          echocardiogram          TEE 01/04/2019:  ? Left Ventricle:  The left ventricular size is normal. The left ventricular systolic function is borderline. The visually estimated ejection fraction is 50%.  ? Left Atrium: Dilated left atrium. Right Atrium: Dilated right atrium.  ? 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  ? Watchman/left atrial appendage occlusion device, without associated thrombus. Again noted an anterior peridevice 4-34mm residual flow on color Doppler      Exercise tolerance: >4 METS      Hypertension,         Dysrhythmias (Chronic A. Fib with watchman device); atrial fibrillation      GI/Hepatic/Renal         Liver disease      Cirrhosis      Hx of GI bleeed        Hepatitis C      Neuro/Psych - negative        Musculoskeletal         Hx of spinal surgery      Endocrine/Other       No diabetes      Anemia (Hgb: 7.4)   Physical Exam    Airway Findings      Mallampati: II      TM distance: >3 FB      Neck ROM: full      Mouth opening: good Airway patency: adequate    Dental Findings:       Upper dentures, lower dentures and full    Cardiovascular Findings: Negative      Pulmonary Findings: Negative      Abdominal Findings: Negative      Neurological Findings: Negative      Constitutional findings: Negative       Diagnostic Tests  Hematology:   Lab Results   Component Value Date    HGB 7.4 01/31/2019    HCT 21.8 01/31/2019    PLTCT 118 01/31/2019    WBC 5.9 01/31/2019    NEUT 55 01/31/2019    ANC 3.48 01/31/2019    ALC 2.07 01/31/2019    MONA 9 01/31/2019    AMC 0.54 01/31/2019    EOSA 2 01/31/2019    ABC 0.06 01/31/2019    MCV 96.7 01/31/2019    MCH 32.7 01/31/2019    MCHC 33.8 01/31/2019    MPV 9.5 01/31/2019    RDW 17.5 01/31/2019         General Chemistry:   Lab Results   Component Value Date    NA 142 01/31/2019    K 4.3 01/31/2019    CL 112 01/31/2019    CO2 23 01/31/2019    GAP 7 01/31/2019    BUN 17 01/31/2019    CR 1.13 01/31/2019    GLU 77 01/31/2019    CA 8.2 01/31/2019    ALBUMIN 3.0 01/31/2019    MG 1.9 01/31/2019    TOTBILI 0.6 01/31/2019    PO4 3.7 01/31/2019      Coagulation:   Lab Results   Component Value Date    PTT 26.3 01/30/2019    INR 1.2 01/31/2019         Anesthesia Plan    ASA score: 4   Plan: general  Induction method: intravenous  NPO status: acceptable      Informed Consent  Anesthetic plan and risks discussed with patient.  Use of blood products discussed with patient  Blood Consent: consented      Plan discussed with: anesthesiologist and surgeon/proceduralist.  Comments: (Pt seen  and identified in holding, history and physical performed, all questions answered. Plan for GA + LMA, large bore IV, ASA monitors.  Risks of GA with LMA/ETT including sore throat, oral/dental injury, allergic reactions, PONV, aspiration, respiratory failure, MI, CVA discussed with patient who reports understanding and consents to anesthetic plan.)

## 2019-01-31 NOTE — Progress Notes
Examined patient given prior findings of emphysematous gastritis. Patient was resting comfortable.    BP: (70-197)/(51-129)   Temp:  [36.4 C (97.6 F)-37.1 C (98.8 F)]   Pulse:  [61-173]   Respirations:  [16 PER MINUTE-45 PER MINUTE]   SpO2:  [84 %-100 %]     Cardiac: regular rate and rhythm  Resp: intubated  Abdomen: soft, non-distended, no guarding or rebound tenderness. No obvious TTP present.  Neuro: sedated    - Continue serial abd exams    Discussed with Dr. Oswaldo Milian, MD   Pager [phone]

## 2019-02-01 ENCOUNTER — Inpatient Hospital Stay: Admit: 2019-02-01 | Discharge: 2019-02-01

## 2019-02-01 NOTE — Anesthesia Post-Procedure Evaluation
Post-Anesthesia Evaluation    Name: Alexis Weeks      MRN: [id number]     DOB: [birth year]     Age: 68 y.o.     Sex: male   __________________________________________________________________________     Procedure Information     Anesthesia Start Date/Time: 01/31/19 1646    Procedures:       BRONCHOSCOPY DIAGNOSTIC WITH CELL WASHING - FLEXIBLE (Bilateral Bronchus)      BRONCHOSCOPY WITH BALLOON OCCLUSION - FLEXIBLE (Left Bronchus)    Location: MAIN OR 18 / Main OR/Periop    Surgeon: Shella Maxim, MD          Post-Anesthesia Vitals  Temp: 36.9 C (98.4 F) (11/18 2004)  Pulse: 138 (11/18 2004)  Respirations: 18 PER MINUTE (11/18 2004)  SpO2: 94 % (11/18 2004)  SpO2 Pulse: 134 (11/18 2004)   Vitals Value Taken Time   BP     Temp 36.9 C (98.4 F) 01/31/19 2004   Pulse 138 01/31/19 2004   Respirations 18 PER MINUTE 01/31/19 2004   SpO2 94 % 01/31/19 2004         Post Anesthesia Evaluation Note    Evaluation location: ICU  Patient participation: patient intubated, unable to assess; expectation of recovery by ICU physician  Level of consciousness: intubated & sedated    Ventilator settings  Mode: VC  FIO2: 1  Tidal Volume: 400  Vent rate: 18  PEEP: 5    Pain score: Pain scale: UTA.  Pain management: Pain control: UTA.    Hydration: normovolemia  Temperature: 36.0C - 38.4C  Airway patency: adequate    Perioperative Events       Post-op nausea and vomiting: PONV status: UTA.    Postoperative Status  Cardiovascular status: hemodynamically stable  Respiratory status: supplemental oxygen and ETT  ICU Information  VasoactiveDrips:norepinephrine infusion and currently paused  Blood Products Given-yes  PRBC units given: 3              Staff involved in transport include: anesthesiologist, CRNA, resp therapy and OR nurse      Perioperative Events  Perioperative Event: Yes  Emergency Case Activation: Yes  Cardiovascular: sustained hypotension (BP < 20% baseline)

## 2019-02-02 ENCOUNTER — Inpatient Hospital Stay: Admit: 2019-02-02 | Discharge: 2019-02-02

## 2019-02-02 ENCOUNTER — Encounter: Admit: 2019-02-02 | Discharge: 2019-02-02

## 2019-02-02 DIAGNOSIS — Z8719 Personal history of other diseases of the digestive system: Secondary | ICD-10-CM

## 2019-02-02 DIAGNOSIS — K759 Inflammatory liver disease, unspecified: Secondary | ICD-10-CM

## 2019-02-02 DIAGNOSIS — I10 Essential (primary) hypertension: Secondary | ICD-10-CM

## 2019-02-02 DIAGNOSIS — R079 Chest pain, unspecified: Secondary | ICD-10-CM

## 2019-02-02 DIAGNOSIS — I4891 Unspecified atrial fibrillation: Secondary | ICD-10-CM

## 2019-02-02 DIAGNOSIS — J449 Chronic obstructive pulmonary disease, unspecified: Secondary | ICD-10-CM

## 2019-02-02 MED ORDER — DEXTROSE 50 % IN WATER (D50W) IV SOLP
50 mL | Freq: Once | INTRAVENOUS | 0 refills | Status: CP
Start: 2019-02-02 — End: ?
  Administered 2019-02-02: 14:00:00 50 mL via INTRAVENOUS

## 2019-02-02 MED ORDER — DEXMEDETOMIDINE IN 0.9 % NACL 400 MCG/100 ML (4 MCG/ML) IV SOLN
.2-1.5 ug/kg/h | INTRAVENOUS | 0 refills | Status: DC
Start: 2019-02-02 — End: 2019-02-05
  Administered 2019-02-03: 08:00:00 0.2 ug/kg/h via INTRAVENOUS
  Administered 2019-02-03: 16:00:00 0.8 ug/kg/h via INTRAVENOUS
  Administered 2019-02-03 – 2019-02-04 (×3): 1 ug/kg/h via INTRAVENOUS

## 2019-02-02 MED ORDER — DEXTROSE 5%-0.45% SODIUM CHLORIDE IV SOLP
1000 mL | Freq: Once | INTRAVENOUS | 0 refills | Status: CP
Start: 2019-02-02 — End: ?
  Administered 2019-02-02: 21:00:00 1000 mL via INTRAVENOUS

## 2019-02-02 MED ORDER — NOREPINEPHRINE BITARTRATE-NACL 4 MG/250 ML (16 MCG/ML) IV SOLN
0-.5 ug/kg/min | INTRAVENOUS | 0 refills | Status: DC
Start: 2019-02-02 — End: 2019-02-04

## 2019-02-02 MED ORDER — MAGNESIUM SULFATE IN D5W 1 GRAM/100 ML IV PGBK
1 g | INTRAVENOUS | 0 refills | Status: CP
Start: 2019-02-02 — End: ?
  Administered 2019-02-02: 14:00:00 1 g via INTRAVENOUS

## 2019-02-02 MED ORDER — METOPROLOL TARTRATE 5 MG/5 ML IV SOLN
5 mg | Freq: Once | INTRAVENOUS | 0 refills | Status: CP
Start: 2019-02-02 — End: ?
  Administered 2019-02-02: 14:00:00 5 mg via INTRAVENOUS

## 2019-02-02 MED ORDER — MAGNESIUM SULFATE IN D5W 1 GRAM/100 ML IV PGBK
1 g | INTRAVENOUS | 0 refills | Status: CP
Start: 2019-02-02 — End: ?
  Administered 2019-02-02: 18:00:00 1 g via INTRAVENOUS

## 2019-02-02 MED ORDER — MAGNESIUM SULFATE IN D5W 1 GRAM/100 ML IV PGBK
1 g | INTRAVENOUS | 0 refills | Status: CP
Start: 2019-02-02 — End: ?
  Administered 2019-02-02: 23:00:00 1 g via INTRAVENOUS

## 2019-02-02 MED ADMIN — NOREPINEPHRINE BITARTRATE-NACL 4 MG/250 ML (16 MCG/ML) IV SOLN [173409]: 0.02 ug/kg/min | INTRAVENOUS | @ 17:00:00 | Stop: 2019-02-02 | NDC 70092103305

## 2019-02-03 MED ORDER — QUETIAPINE 25 MG PO TAB
50 mg | Freq: Every evening | ORAL | 0 refills | Status: DC
Start: 2019-02-03 — End: 2019-02-07
  Administered 2019-02-04 – 2019-02-07 (×4): 50 mg via ORAL

## 2019-02-03 MED ORDER — SENNOSIDES-DOCUSATE SODIUM 8.6-50 MG PO TAB
1 | Freq: Two times a day (BID) | ORAL | 0 refills | Status: DC
Start: 2019-02-03 — End: 2019-02-13
  Administered 2019-02-03 – 2019-02-07 (×7): 1 via ORAL

## 2019-02-03 MED ORDER — PANCRELIPASE-SODIUM BICARBONATE 20,880 K UNIT-650 MG
NASOGASTRIC | 0 refills | Status: DC | PRN
Start: 2019-02-03 — End: 2019-02-08

## 2019-02-03 MED ORDER — MAGNESIUM SULFATE IN D5W 1 GRAM/100 ML IV PGBK
1 g | INTRAVENOUS | 0 refills | Status: CP
Start: 2019-02-03 — End: ?
  Administered 2019-02-03: 15:00:00 1 g via INTRAVENOUS

## 2019-02-03 MED ORDER — POLYETHYLENE GLYCOL 3350 17 GRAM PO PWPK
1 | Freq: Every day | ORAL | 0 refills | Status: DC
Start: 2019-02-03 — End: 2019-02-13
  Administered 2019-02-03 – 2019-02-07 (×4): 17 g via ORAL

## 2019-02-03 MED ORDER — METOPROLOL TARTRATE 25 MG PO TAB
25 mg | Freq: Two times a day (BID) | ORAL | 0 refills | Status: DC
Start: 2019-02-03 — End: 2019-02-06
  Administered 2019-02-03 – 2019-02-06 (×7): 25 mg via ORAL

## 2019-02-03 MED ORDER — MAGNESIUM SULFATE IN D5W 1 GRAM/100 ML IV PGBK
1 g | INTRAVENOUS | 0 refills | Status: CP
Start: 2019-02-03 — End: ?
  Administered 2019-02-03: 20:00:00 1 g via INTRAVENOUS

## 2019-02-04 MED ORDER — CEFEPIME 1G/100ML NS IVPB (MB+)
1 g | INTRAVENOUS | 0 refills | Status: DC
Start: 2019-02-04 — End: 2019-02-04

## 2019-02-04 MED ORDER — CHLORHEXIDINE GLUCONATE 0.12 % MM MWSH
15 mL | Freq: Two times a day (BID) | ORAL | 0 refills | Status: DC
Start: 2019-02-04 — End: 2019-02-07
  Administered 2019-02-04 – 2019-02-07 (×7): 15 mL via ORAL

## 2019-02-04 MED ORDER — ACETAMINOPHEN 160 MG/5 ML PO SOLN
650 mg | ORAL | 0 refills | Status: DC | PRN
Start: 2019-02-04 — End: 2019-02-15
  Administered 2019-02-04 – 2019-02-15 (×10): 650 mg via ORAL

## 2019-02-04 MED ORDER — METRONIDAZOLE IN NACL (ISO-OS) 500 MG/100 ML IV PGBK
500 mg | INTRAVENOUS | 0 refills | Status: DC
Start: 2019-02-04 — End: 2019-02-06
  Administered 2019-02-04 – 2019-02-06 (×6): 500 mg via INTRAVENOUS

## 2019-02-04 MED ORDER — CEFEPIME 2G/100ML NS IVPB (MB+)
2 g | INTRAVENOUS | 0 refills | Status: DC
Start: 2019-02-04 — End: 2019-02-06
  Administered 2019-02-04 – 2019-02-06 (×12): 2 g via INTRAVENOUS

## 2019-02-04 MED ORDER — NOREPINEPHRINE BITARTRATE-NACL 4 MG/250 ML (16 MCG/ML) IV SOLN
0-.5 ug/kg/min | INTRAVENOUS | 0 refills | Status: DC
Start: 2019-02-04 — End: 2019-02-08
  Administered 2019-02-04: 21:00:00 0.01 ug/kg/min via INTRAVENOUS
  Administered 2019-02-05: 16:00:00 .04 ug/kg/min via INTRAVENOUS

## 2019-02-04 MED ORDER — LINEZOLID IN DEXTROSE 5% 600 MG/300 ML IV PGBK
600 mg | Freq: Two times a day (BID) | INTRAVENOUS | 0 refills | Status: DC
Start: 2019-02-04 — End: 2019-02-06
  Administered 2019-02-04 – 2019-02-06 (×4): 600 mg via INTRAVENOUS

## 2019-02-04 MED ORDER — HEPARIN, PORCINE (PF) 5,000 UNIT/0.5 ML IJ SYRG
5000 [IU] | Freq: Once | SUBCUTANEOUS | 0 refills | Status: CN
Start: 2019-02-04 — End: ?

## 2019-02-05 ENCOUNTER — Inpatient Hospital Stay: Admit: 2019-02-05 | Discharge: 2019-02-05

## 2019-02-05 ENCOUNTER — Encounter: Admit: 2019-02-05 | Discharge: 2019-02-05

## 2019-02-05 MED ORDER — ELECTROLYTE-A IV SOLP
0 refills | Status: DC
Start: 2019-02-05 — End: 2019-02-05
  Administered 2019-02-05: 16:00:00 via INTRAVENOUS

## 2019-02-05 MED ORDER — BUPIVACAINE LIPOSOME (PF) 1.3 % (13.3 MG/ML) LINF SUSP
266 mg | Freq: Once | 0 refills | Status: CP
Start: 2019-02-05 — End: ?
  Administered 2019-02-05: 19:00:00 266 mg

## 2019-02-05 MED ORDER — VASOPRESSIN 20 UNIT/ML IV SOLN
0 refills | Status: DC
Start: 2019-02-05 — End: 2019-02-05
  Administered 2019-02-05: 18:00:00 1 [IU] via INTRAVENOUS
  Administered 2019-02-05: 17:00:00 2 [IU] via INTRAVENOUS

## 2019-02-05 MED ORDER — DEXTRAN 70-HYPROMELLOSE (PF) 0.1-0.3 % OP DPET
0 refills | Status: DC
Start: 2019-02-05 — End: 2019-02-05
  Administered 2019-02-05: 17:00:00 2 [drp] via OPHTHALMIC

## 2019-02-05 MED ORDER — SODIUM CHLORIDE 0.9 % IR SOLN
0 refills | Status: DC
Start: 2019-02-05 — End: 2019-02-05
  Administered 2019-02-05: 17:00:00 1000 mL

## 2019-02-05 MED ORDER — MAGNESIUM SULFATE IN D5W 1 GRAM/100 ML IV PGBK
1 g | Freq: Once | INTRAVENOUS | 0 refills | Status: CP
Start: 2019-02-05 — End: ?
  Administered 2019-02-06: 03:00:00 1 g via INTRAVENOUS

## 2019-02-05 MED ORDER — ROCURONIUM 10 MG/ML IV SOLN
INTRAVENOUS | 0 refills | Status: DC
Start: 2019-02-05 — End: 2019-02-05
  Administered 2019-02-05: 16:00:00 40 mg via INTRAVENOUS
  Administered 2019-02-05: 17:00:00 20 mg via INTRAVENOUS

## 2019-02-05 MED ORDER — KETAMINE 10 MG/ML IJ SOLN
0 refills | Status: DC
Start: 2019-02-05 — End: 2019-02-05
  Administered 2019-02-05: 17:00:00 30 mg via INTRAVENOUS

## 2019-02-05 MED ORDER — FENTANYL CITRATE (PF) 50 MCG/ML IJ SOLN
0 refills | Status: DC
Start: 2019-02-05 — End: 2019-02-05
  Administered 2019-02-05: 17:00:00 100 ug via INTRAVENOUS

## 2019-02-05 MED ORDER — MIDAZOLAM 1 MG/ML IJ SOLN
INTRAVENOUS | 0 refills | Status: DC
Start: 2019-02-05 — End: 2019-02-05
  Administered 2019-02-05: 17:00:00 2 mg via INTRAVENOUS

## 2019-02-05 MED ORDER — ESMOLOL 100 MG/10 ML (10 MG/ML) IV SOLN
0 refills | Status: DC
Start: 2019-02-05 — End: 2019-02-05
  Administered 2019-02-05: 17:00:00 50 mg via INTRAVENOUS

## 2019-02-05 NOTE — Anesthesia Pre-Procedure Evaluation
Anesthesia Pre-Procedure Evaluation    Name: Alexis Weeks      MRN: [id number]     DOB: 1950/06/17     Age: 68 y.o.     Sex: male   _________________________________________________________________________     Procedure Info:   Procedure Information     Anesthesia Start Date/Time: 02/05/19 1012    Procedure: Left upper lobectomy via thoracotomy (Left )    Location: [facility] 1 / [facility]    Provider: Arlyce Harman, MD          Physical Assessment  Vital Signs (last filed in past 24 hours):  BP: 120/56 (11/23 1341)  ABP: 153/77 (11/23 1409)  Temp: 36.7 ?C (98 ?F) (11/23 1341)  Pulse: 95 (11/23 1400)  Respirations: 24 PER MINUTE (11/23 1400)  SpO2: 100 % (11/23 1400)      Patient History   Allergies   Allergen Reactions   ? Penicillins HIVES and EDEMA        Current Medications    Medication Directions   albuterol sulfate (PROAIR HFA) 90 mcg/actuation HFA aerosol inhaler Inhale one puff to two puffs by mouth into the lungs every 6 hours as needed for Wheezing or Shortness of Breath. Shake well before use.   aspirin 81 mg chewable tablet Chew one tablet by mouth daily. Take with food.   azithromycin (ZITHROMAX) 250 mg tablet Take two tablets by mouth three times weekly.   cholecalciferol (VITAMIN D3) 2,000 unit tablet Take 2,000 Units by mouth daily.   cyclobenzaprine (FLEXERIL) 10 mg tablet Take 10 mg by mouth three times daily as needed for Muscle Cramps.   diltiazem CD (CARDIZEM CD) 360 mg capsule Take one capsule by mouth daily.   docusate (COLACE) 100 mg capsule Take 100 mg by mouth daily as needed for Constipation.   dronabinoL (MARINOL) 5 mg capsule Take 5 mg by mouth twice daily.   duloxetine DR (CYMBALTA) 60 mg capsule TAKE ONE CAPSULE BY MOUTH EVERY DAY AS DIRECTED   gabapentin (NEURONTIN) 300 mg capsule Take 300 mg by mouth every 8 hours as needed. lipase-protease-amylase (PANCREAZE) 21,000-54,700- 83,900 units capsule Take one capsule by mouth as directed. Take 3 tablets with every meal and 2 tablets with everysnack   lisinopriL (ZESTRIL) 10 mg tablet Take 10 mg by mouth daily.   metoprolol tartrate (LOPRESSOR) 100 mg tablet Take 100 mg by mouth twice daily.   morphine SR (MS CONTIN) 15 mg tablet Take 15 mg by mouth every 12 hours   oxybutynin chloride (DITROPAN) 5 mg tablet Take one tablet by mouth three times daily as needed.   oxycodone (ROXICODONE) 20 mg tablet Take 20 mg by mouth every 6 hours as needed     umeclidinium-vilanteroL (ANORO ELLIPTA) 62.5-25 mcg/actuation inhaler Inhale one puff by mouth into the lungs daily.         Review of Systems/Medical History      Patient summary reviewed  Pertinent labs reviewed    No history of anesthetic complications  No family history of anesthetic complications      Airway         Hemoptysis. Currently intubated and sedated      Pulmonary       Current smoker      COPD      Hx of Bronchiectasis  Hematemesis and CT showing emphysematous gastritis  Current acute respiratory failure requiring intubation.    PFT 12/2017:  FVC-Pre    3.14   FVC-%Pred-pre    65  FVC-Post    2.89  FVC-%Pred-Post: 60         Cardiovascular       Recent diagnostic studies:          echocardiogram          TEE 01/04/2019:  ? Left Ventricle: The left ventricular size is normal. The left ventricular systolic function is borderline. The visually estimated ejection fraction is 50%.  ? Left Atrium: Dilated left atrium. Right Atrium: Dilated right atrium.  ? 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  ? Watchman/left atrial appendage occlusion device, without associated thrombus. Again noted an anterior peridevice 4-58mm residual flow on color Doppler      Exercise tolerance: >4 METS      Hypertension, Dysrhythmias (Chronic A. Fib with watchman device); atrial fibrillation      GI/Hepatic/Renal         Liver disease      Cirrhosis      Hx of GI bleeed        Hepatitis C      Neuro/Psych - negative        Musculoskeletal         Hx of spinal surgery      Endocrine/Other       No diabetes      Anemia (Hgb: 7.4)   Physical Exam    Airway Findings      Mallampati: II      TM distance: >3 FB      Neck ROM: full      Mouth opening: good      Airway patency: adequate    Dental Findings:       Upper dentures, lower dentures and full    Cardiovascular Findings: Negative      Pulmonary Findings: Negative      Abdominal Findings: Negative      Neurological Findings: Negative      Constitutional findings: Negative       Diagnostic Tests  Hematology:   Lab Results   Component Value Date    HGB 9.2 02/05/2019    HCT 26.4 02/05/2019    PLTCT 122 02/05/2019    WBC 8.6 02/05/2019    NEUT 68 02/05/2019    ANC 5.89 02/05/2019    ALC 1.71 02/05/2019    MONA 10 02/05/2019    AMC 0.86 02/05/2019    EOSA 1 02/05/2019    ABC 0.06 02/05/2019    MCV 96.0 02/05/2019    MCH 33.3 02/05/2019    MCHC 34.7 02/05/2019    MPV 8.6 02/05/2019    RDW 15.9 02/05/2019         General Chemistry:   Lab Results   Component Value Date    NA 143 02/05/2019    K 3.6 02/05/2019    CL 115 02/05/2019    CO2 24 02/05/2019    GAP 4 02/05/2019    BUN 23 02/05/2019    CR 0.70 02/05/2019    GLU 139 02/05/2019    CA 7.6 02/05/2019    ALBUMIN 2.6 02/05/2019    LACTIC 0.5 01/31/2019    OBSCA 1.16 01/31/2019    MG 1.8 02/05/2019    TOTBILI 0.8 02/05/2019    PO4 2.2 02/05/2019      Coagulation:   Lab Results   Component Value Date    PTT 29.1 01/31/2019    INR 1.3 02/05/2019         Anesthesia Plan  ASA score: 4   Plan: general  Induction method: intravenous  NPO status: acceptable      Informed Consent  Anesthetic plan and risks discussed with patient.  Use of blood products discussed with patient  Blood Consent: consented Plan discussed with: anesthesiologist and surgeon/proceduralist.  Comments: (Pt seen and identified in holding, history and physical performed, all questions answered. Plan for GA + LMA, large bore IV, ASA monitors.  Risks of GA with LMA/ETT including sore throat, oral/dental injury, allergic reactions, PONV, aspiration, respiratory failure, MI, CVA discussed with patient who reports understanding and consents to anesthetic plan.)

## 2019-02-05 NOTE — Anesthesia Post-Procedure Evaluation
Post-Anesthesia Evaluation    Name: Alexis Weeks      MRN: [id number]     DOB: [birth year]     Age: 68 y.o.     Sex: male   __________________________________________________________________________     Procedure Information     Anesthesia Start Date/Time: 02/05/19 1012    Procedure: Left upper lobectomy via thoracotomy (Left )    Location: [facility] 1 / [facility]OR    Provider: Nelida Meuse, MD          Post-Anesthesia Vitals  BP: 120/56 (11/23 1341)  Pulse: 102 (11/23 1341)  Respirations: 25 PER MINUTE (11/23 1341)  SpO2: 100 % (11/23 1341)  SpO2 Pulse: 85 (11/23 1341)   Vitals Value Taken Time   BP 120/56 02/05/19 1341   Temp     Pulse 102 02/05/19 1341   Respirations 25 PER MINUTE 02/05/19 1341   SpO2 100 % 02/05/19 1341         Post Anesthesia Evaluation Note    Evaluation location: ICU  Patient participation: patient intubated, unable to assess; expectation of recovery by ICU physician  Level of consciousness: intubated & sedated    Ventilator settings  Mode: VC  FIO2: 100  Tidal Volume: 18  Vent rate: 450  PEEP: 5    Hydration: normovolemia  Temperature: 36.0C - 38.4C  Airway patency: adequate    Perioperative Events       Post-op nausea and vomiting: no PONV    Postoperative Status  Cardiovascular status: hemodynamically stable  Respiratory status: ETT  ICU Information  VasoactiveDrips:norepinephrine infusion        Staff involved in transport include: OR nurse, anesthesiologist and CRNA      Perioperative Events  Perioperative Event: No  Emergency Case Activation: No

## 2019-02-06 ENCOUNTER — Inpatient Hospital Stay: Admit: 2019-02-06 | Discharge: 2019-02-06

## 2019-02-06 ENCOUNTER — Encounter: Admit: 2019-02-06 | Discharge: 2019-02-06

## 2019-02-06 MED ORDER — METOPROLOL TARTRATE 50 MG PO TAB
50 mg | Freq: Two times a day (BID) | ORAL | 0 refills | Status: DC
Start: 2019-02-06 — End: 2019-02-06

## 2019-02-06 MED ORDER — ACETAMINOPHEN 500 MG PO TAB
1000 mg | ORAL | 0 refills | Status: AC
Start: 2019-02-06 — End: ?
  Administered 2019-02-07 – 2019-02-09 (×7): 1000 mg via ORAL

## 2019-02-06 MED ORDER — IPRATROPIUM BROMIDE 0.02 % IN SOLN
.5 mg | RESPIRATORY_TRACT | 0 refills | Status: DC | PRN
Start: 2019-02-06 — End: 2019-02-08

## 2019-02-06 MED ORDER — METOPROLOL TARTRATE 50 MG PO TAB
100 mg | Freq: Two times a day (BID) | NASOGASTRIC | 0 refills | Status: DC
Start: 2019-02-06 — End: 2019-02-08
  Administered 2019-02-07 – 2019-02-08 (×3): 100 mg via NASOGASTRIC

## 2019-02-06 MED ORDER — SODIUM CHLORIDE 7 % IN NEBU
4 mL | RESPIRATORY_TRACT | 0 refills | Status: DC | PRN
Start: 2019-02-06 — End: 2019-02-08
  Administered 2019-02-06 – 2019-02-07 (×4): 4 mL via RESPIRATORY_TRACT

## 2019-02-06 MED ORDER — ALBUTEROL SULFATE 2.5 MG/0.5 ML IN NEBU
2.5 mg | RESPIRATORY_TRACT | 0 refills | Status: DC | PRN
Start: 2019-02-06 — End: 2019-02-08
  Administered 2019-02-06 – 2019-02-07 (×4): 2.5 mg via RESPIRATORY_TRACT

## 2019-02-06 MED ORDER — DEXMEDETOMIDINE IN 0.9 % NACL 400 MCG/100 ML (4 MCG/ML) IV SOLN
.2-.5 ug/kg/h | INTRAVENOUS | 0 refills | Status: DC
Start: 2019-02-06 — End: 2019-02-07
  Administered 2019-02-06: 22:00:00 0.3 ug/kg/h via INTRAVENOUS

## 2019-02-06 MED ORDER — LEVOFLOXACIN IN D5W 750 MG/150 ML IV PGBK
750 mg | INTRAVENOUS | 0 refills | Status: CP
Start: 2019-02-06 — End: ?
  Administered 2019-02-06 – 2019-02-11 (×6): 750 mg via INTRAVENOUS

## 2019-02-06 MED ORDER — ACETAMINOPHEN 1,000 MG/100 ML (10 MG/ML) IV SOLN
1000 mg | Freq: Once | INTRAVENOUS | 0 refills | Status: CP
Start: 2019-02-06 — End: ?
  Administered 2019-02-06: 20:00:00 1000 mg via INTRAVENOUS

## 2019-02-06 MED ORDER — LACTATED RINGERS IV SOLP
500 mL | Freq: Once | INTRAVENOUS | 0 refills | Status: CP
Start: 2019-02-06 — End: ?
  Administered 2019-02-06: 23:00:00 500 mL via INTRAVENOUS

## 2019-02-06 MED ORDER — TRAMADOL 50 MG PO TAB
50-100 mg | ORAL | 0 refills | Status: DC | PRN
Start: 2019-02-06 — End: 2019-02-15
  Administered 2019-02-08: 01:00:00 50 mg via ORAL
  Administered 2019-02-08: 16:00:00 100 mg via ORAL

## 2019-02-06 MED ORDER — METOPROLOL TARTRATE 25 MG PO TAB
25 mg | Freq: Once | ORAL | 0 refills | Status: CP
Start: 2019-02-06 — End: ?
  Administered 2019-02-06: 17:00:00 25 mg via ORAL

## 2019-02-06 MED ORDER — OXYCODONE 5 MG PO TAB
5-10 mg | NASOGASTRIC | 0 refills | Status: DC | PRN
Start: 2019-02-06 — End: 2019-02-07
  Administered 2019-02-06: 20:00:00 5 mg via NASOGASTRIC

## 2019-02-06 MED ORDER — ALBUTEROL SULFATE 2.5 MG/0.5 ML IN NEBU
2.5 mg | RESPIRATORY_TRACT | 0 refills | Status: DC | PRN
Start: 2019-02-06 — End: 2019-02-08

## 2019-02-06 MED ORDER — METOPROLOL TARTRATE 50 MG PO TAB
50 mg | Freq: Once | ORAL | 0 refills | Status: CP
Start: 2019-02-06 — End: ?
  Administered 2019-02-06: 22:00:00 50 mg via ORAL

## 2019-02-07 ENCOUNTER — Inpatient Hospital Stay: Admit: 2019-02-07 | Discharge: 2019-02-07

## 2019-02-07 ENCOUNTER — Encounter: Admit: 2019-02-07 | Discharge: 2019-02-07

## 2019-02-07 DIAGNOSIS — Z8719 Personal history of other diseases of the digestive system: Secondary | ICD-10-CM

## 2019-02-07 DIAGNOSIS — R079 Chest pain, unspecified: Secondary | ICD-10-CM

## 2019-02-07 DIAGNOSIS — I10 Essential (primary) hypertension: Secondary | ICD-10-CM

## 2019-02-07 DIAGNOSIS — K759 Inflammatory liver disease, unspecified: Secondary | ICD-10-CM

## 2019-02-07 DIAGNOSIS — I4891 Unspecified atrial fibrillation: Secondary | ICD-10-CM

## 2019-02-07 DIAGNOSIS — J449 Chronic obstructive pulmonary disease, unspecified: Secondary | ICD-10-CM

## 2019-02-07 MED ORDER — DEXMEDETOMIDINE IN 0.9 % NACL 400 MCG/100 ML (4 MCG/ML) IV SOLN
.2-1 ug/kg/h | INTRAVENOUS | 0 refills | Status: DC
Start: 2019-02-07 — End: 2019-02-08
  Administered 2019-02-08: 02:00:00 0.2 ug/kg/h via INTRAVENOUS

## 2019-02-07 MED ORDER — FENTANYL CITRATE (PF) 50 MCG/ML IJ SOLN
50 ug | Freq: Once | INTRAVENOUS | 0 refills | Status: CP
Start: 2019-02-07 — End: ?

## 2019-02-07 MED ORDER — ASPIRIN 81 MG PO CHEW
81 mg | Freq: Every day | ORAL | 0 refills | Status: DC
Start: 2019-02-07 — End: 2019-02-15
  Administered 2019-02-07 – 2019-02-15 (×8): 81 mg via ORAL

## 2019-02-07 MED ORDER — SODIUM CHLORIDE 7 % IN NEBU
4 mL | RESPIRATORY_TRACT | 0 refills | Status: DC
Start: 2019-02-07 — End: 2019-02-15
  Administered 2019-02-08 – 2019-02-15 (×23): 4 mL via RESPIRATORY_TRACT

## 2019-02-07 MED ORDER — CYCLOBENZAPRINE 5 MG PO TAB
10 mg | Freq: Three times a day (TID) | ORAL | 0 refills | Status: DC | PRN
Start: 2019-02-07 — End: 2019-02-15
  Administered 2019-02-08 – 2019-02-14 (×6): 10 mg via ORAL

## 2019-02-07 MED ORDER — DILTIAZEM(#) 10MG/ML PO SUSP
30 mg | ORAL | 0 refills | Status: DC
Start: 2019-02-07 — End: 2019-02-07
  Administered 2019-02-07: 18:00:00 30 mg via ORAL

## 2019-02-07 MED ORDER — QUETIAPINE 25 MG PO TAB
12.5 mg | Freq: Once | NASOGASTRIC | 0 refills | Status: CP
Start: 2019-02-07 — End: ?
  Administered 2019-02-08: 04:00:00 12.5 mg via NASOGASTRIC

## 2019-02-07 MED ORDER — ALBUTEROL SULFATE 2.5 MG/0.5 ML IN NEBU
2.5 mg | Freq: Four times a day (QID) | RESPIRATORY_TRACT | 0 refills | Status: DC | PRN
Start: 2019-02-07 — End: 2019-02-15
  Administered 2019-02-08 – 2019-02-15 (×22): 2.5 mg via RESPIRATORY_TRACT

## 2019-02-07 MED ORDER — CHOLECALCIFEROL (VITAMIN D3) 25 MCG (1,000 UNIT) PO TAB
2000 [IU] | Freq: Every day | ORAL | 0 refills | Status: DC
Start: 2019-02-07 — End: 2019-02-15
  Administered 2019-02-07 – 2019-02-15 (×7): 2000 [IU] via ORAL

## 2019-02-07 MED ORDER — FENTANYL CITRATE (PF) 50 MCG/ML IJ SOLN
100 ug | Freq: Once | INTRAVENOUS | 0 refills | Status: CP
Start: 2019-02-07 — End: ?

## 2019-02-07 MED ORDER — HEPARIN, PORCINE (PF) 5,000 UNIT/0.5 ML IJ SYRG
5000 [IU] | SUBCUTANEOUS | 0 refills | Status: DC
Start: 2019-02-07 — End: 2019-02-15
  Administered 2019-02-07 – 2019-02-09 (×6): 5000 [IU] via SUBCUTANEOUS

## 2019-02-07 MED ORDER — MIDAZOLAM 1 MG/ML IJ SOLN
2 mg | Freq: Once | INTRAVENOUS | 0 refills | Status: CP
Start: 2019-02-07 — End: ?

## 2019-02-07 MED ORDER — DEXMEDETOMIDINE IN 0.9 % NACL 400 MCG/100 ML (4 MCG/ML) IV SOLN
.2-.5 ug/kg/h | INTRAVENOUS | 0 refills | Status: DC
Start: 2019-02-07 — End: 2019-02-08
  Administered 2019-02-08: 06:00:00 0.2 ug/kg/h via INTRAVENOUS

## 2019-02-07 MED ORDER — CALCIUM CHLORIDE 100 MG/ML (10 %) IV SYRG
1 g | Freq: Once | INTRAVENOUS | 0 refills | Status: CP
Start: 2019-02-07 — End: ?

## 2019-02-07 MED ORDER — DILTIAZEM(#) 10MG/ML PO SUSP
90 mg | NASOGASTRIC | 0 refills | Status: DC
Start: 2019-02-07 — End: 2019-02-08
  Administered 2019-02-08: 02:00:00 90 mg via NASOGASTRIC

## 2019-02-07 MED ORDER — FUROSEMIDE 10 MG/ML IJ SOLN
20 mg | Freq: Once | INTRAVENOUS | 0 refills | Status: CP
Start: 2019-02-07 — End: ?
  Administered 2019-02-07: 12:00:00 20 mg via INTRAVENOUS

## 2019-02-07 MED ADMIN — FENTANYL CITRATE (PF) 50 MCG/ML IJ SOLN [3037]: 100 ug | INTRAVENOUS | @ 15:00:00 | Stop: 2019-02-07 | NDC 00409909412

## 2019-02-07 MED ADMIN — FENTANYL CITRATE (PF) 50 MCG/ML IJ SOLN [3037]: 50 ug | INTRAVENOUS | Stop: 2019-02-07 | NDC 00409909412

## 2019-02-07 MED ADMIN — MIDAZOLAM 1 MG/ML IJ SOLN [10607]: 1 mg | INTRAVENOUS | @ 15:00:00 | Stop: 2019-02-07 | NDC 00409230516

## 2019-02-08 ENCOUNTER — Inpatient Hospital Stay: Admit: 2019-02-08 | Discharge: 2019-02-08

## 2019-02-08 MED ORDER — POTASSIUM, SODIUM PHOSPHATES 280-160-250 MG PO PWPK
1 | NASOGASTRIC | 0 refills | Status: DC | PRN
Start: 2019-02-08 — End: 2019-02-08

## 2019-02-08 MED ORDER — POTASSIUM PHOSPHATE, MONOBASIC 500 MG PO TBSO
2 | ORAL | 0 refills | Status: DC | PRN
Start: 2019-02-08 — End: 2019-02-08

## 2019-02-08 MED ORDER — POTASSIUM CHLORIDE 20 MEQ/15 ML PO LIQD
40 meq | Freq: Once | NASOGASTRIC | 0 refills | Status: CP
Start: 2019-02-08 — End: ?
  Administered 2019-02-08: 11:00:00 40 meq via NASOGASTRIC

## 2019-02-08 MED ORDER — POTASSIUM PHOSPHATE, MONOBASIC 500 MG PO TBSO
2 | Freq: Two times a day (BID) | NASOGASTRIC | 0 refills | Status: AC
Start: 2019-02-08 — End: ?
  Administered 2019-02-08: 21:00:00 2 via NASOGASTRIC

## 2019-02-08 MED ORDER — METOPROLOL TARTRATE 50 MG PO TAB
100 mg | Freq: Two times a day (BID) | NASOGASTRIC | 0 refills | Status: DC
Start: 2019-02-08 — End: 2019-02-15
  Administered 2019-02-08 – 2019-02-15 (×13): 100 mg via NASOGASTRIC

## 2019-02-08 MED ORDER — OXYCODONE 5 MG PO TAB
5-10 mg | ORAL | 0 refills | Status: DC | PRN
Start: 2019-02-08 — End: 2019-02-15
  Administered 2019-02-08 (×2): 10 mg via ORAL
  Administered 2019-02-09: 04:00:00 5 mg via ORAL
  Administered 2019-02-09 – 2019-02-14 (×20): 10 mg via ORAL
  Administered 2019-02-14: 21:00:00 5 mg via ORAL
  Administered 2019-02-14: 13:00:00 10 mg via ORAL
  Administered 2019-02-14: 21:00:00 5 mg via ORAL
  Administered 2019-02-14 – 2019-02-15 (×3): 10 mg via ORAL

## 2019-02-08 MED ORDER — SODIUM PHOSPHATE IVPB
8 MMOL | INTRAVENOUS | 0 refills | Status: DC | PRN
Start: 2019-02-08 — End: 2019-02-08

## 2019-02-08 MED ORDER — UMECLIDINIUM-VILANTEROL 62.5-25 MCG/ACTUATION IN DSDV
1 | Freq: Every day | RESPIRATORY_TRACT | 0 refills | Status: DC
Start: 2019-02-08 — End: 2019-02-15
  Administered 2019-02-08: 19:00:00 1 via RESPIRATORY_TRACT

## 2019-02-08 MED ORDER — OXYBUTYNIN CHLORIDE 5 MG PO TAB
5 mg | Freq: Three times a day (TID) | ORAL | 0 refills | Status: DC | PRN
Start: 2019-02-08 — End: 2019-02-15

## 2019-02-08 MED ORDER — QUETIAPINE 25 MG PO TAB
12.5 mg | Freq: Every evening | ORAL | 0 refills | Status: DC
Start: 2019-02-08 — End: 2019-02-15
  Administered 2019-02-09: 02:00:00 12.5 mg via ORAL

## 2019-02-08 MED ORDER — POTASSIUM PHOSPHATE, MONOBASIC 500 MG PO TBSO
2 | Freq: Once | NASOGASTRIC | 0 refills | Status: DC
Start: 2019-02-08 — End: 2019-02-08

## 2019-02-08 MED ORDER — MAGNESIUM SULFATE IN D5W 1 GRAM/100 ML IV PGBK
1 g | Freq: Once | INTRAVENOUS | 0 refills | Status: CP
Start: 2019-02-08 — End: ?
  Administered 2019-02-08: 11:00:00 1 g via INTRAVENOUS

## 2019-02-08 MED ORDER — DILTIAZEM(#) 10MG/ML PO SUSP
60 mg | ORAL | 0 refills | Status: DC
Start: 2019-02-08 — End: 2019-02-09
  Administered 2019-02-08 – 2019-02-09 (×5): 60 mg via ORAL

## 2019-02-08 MED ORDER — GABAPENTIN 250 MG/5 ML PO SOLN
300 mg | ORAL | 0 refills | Status: DC
Start: 2019-02-08 — End: 2019-02-09
  Administered 2019-02-08 – 2019-02-09 (×3): 300 mg via ORAL

## 2019-02-08 MED ORDER — METOPROLOL TARTRATE 50 MG PO TAB
50 mg | Freq: Two times a day (BID) | NASOGASTRIC | 0 refills | Status: DC
Start: 2019-02-08 — End: 2019-02-08

## 2019-02-08 MED ADMIN — CALCIUM CHLORIDE 100 MG/ML (10 %) IV SYRG [85730]: 1 g | INTRAVENOUS | @ 04:00:00 | Stop: 2019-02-08 | NDC 00409492811

## 2019-02-09 ENCOUNTER — Inpatient Hospital Stay: Admit: 2019-02-09 | Discharge: 2019-02-09

## 2019-02-09 MED ORDER — PANTOPRAZOLE 40 MG PO TBEC
40 mg | Freq: Two times a day (BID) | ORAL | 0 refills | Status: DC
Start: 2019-02-09 — End: 2019-02-15
  Administered 2019-02-10 – 2019-02-15 (×8): 40 mg via ORAL

## 2019-02-09 MED ORDER — NICOTINE 7 MG/24 HR TD PT24
1 | Freq: Every day | TRANSDERMAL | 0 refills | Status: DC
Start: 2019-02-09 — End: 2019-02-15
  Administered 2019-02-10 – 2019-02-15 (×6): 1 via TRANSDERMAL

## 2019-02-09 MED ORDER — GABAPENTIN 300 MG PO CAP
300 mg | ORAL | 0 refills | Status: DC
Start: 2019-02-09 — End: 2019-02-15
  Administered 2019-02-09 – 2019-02-15 (×14): 300 mg via ORAL

## 2019-02-09 MED ORDER — DILTIAZEM HCL 60 MG PO TAB
60 mg | ORAL | 0 refills | Status: DC
Start: 2019-02-09 — End: 2019-02-13
  Administered 2019-02-10 – 2019-02-13 (×10): 60 mg via ORAL

## 2019-02-10 ENCOUNTER — Inpatient Hospital Stay: Admit: 2019-02-10 | Discharge: 2019-02-10

## 2019-02-11 ENCOUNTER — Inpatient Hospital Stay: Admit: 2019-02-11 | Discharge: 2019-02-11

## 2019-02-11 MED ORDER — FUROSEMIDE 10 MG/ML IJ SOLN
40 mg | Freq: Once | INTRAVENOUS | 0 refills | Status: AC
Start: 2019-02-11 — End: ?

## 2019-02-12 ENCOUNTER — Inpatient Hospital Stay: Admit: 2019-02-12 | Discharge: 2019-02-12

## 2019-02-12 MED ORDER — DULOXETINE 60 MG PO CPDR
60 mg | Freq: Every day | ORAL | 0 refills | Status: DC
Start: 2019-02-12 — End: 2019-02-15
  Administered 2019-02-13 – 2019-02-15 (×3): 60 mg via ORAL

## 2019-02-12 MED ORDER — POTASSIUM CHLORIDE 20 MEQ/15 ML PO LIQD
40-60 meq | NASOGASTRIC | 0 refills | Status: DC | PRN
Start: 2019-02-12 — End: 2019-02-15

## 2019-02-12 MED ORDER — MAGNESIUM OXIDE 400 MG (241.3 MG MAGNESIUM) PO TAB
400 mg | Freq: Two times a day (BID) | ORAL | 0 refills | Status: DC
Start: 2019-02-12 — End: 2019-02-15
  Administered 2019-02-13 – 2019-02-15 (×5): 400 mg via ORAL

## 2019-02-12 MED ORDER — POTASSIUM CHLORIDE IN WATER 10 MEQ/50 ML IV PGBK
10 meq | INTRAVENOUS | 0 refills | Status: DC | PRN
Start: 2019-02-12 — End: 2019-02-15

## 2019-02-12 MED ORDER — POTASSIUM CHLORIDE 20 MEQ PO TBTQ
40-60 meq | ORAL | 0 refills | Status: DC | PRN
Start: 2019-02-12 — End: 2019-02-15

## 2019-02-13 ENCOUNTER — Inpatient Hospital Stay: Admit: 2019-02-13 | Discharge: 2019-02-13

## 2019-02-13 MED ORDER — SENNOSIDES-DOCUSATE SODIUM 8.6-50 MG PO TAB
2 | Freq: Two times a day (BID) | ORAL | 0 refills | Status: DC
Start: 2019-02-13 — End: 2019-02-15
  Administered 2019-02-14 – 2019-02-15 (×2): 2 via ORAL

## 2019-02-13 MED ORDER — MAGNESIUM HYDROXIDE 2,400 MG/10 ML PO SUSP
10 mL | Freq: Two times a day (BID) | ORAL | 0 refills | Status: DC
Start: 2019-02-13 — End: 2019-02-15

## 2019-02-13 MED ORDER — DILTIAZEM HCL 30 MG PO TAB
30 mg | ORAL | 0 refills | Status: DC
Start: 2019-02-13 — End: 2019-02-15
  Administered 2019-02-13 – 2019-02-15 (×8): 30 mg via ORAL

## 2019-02-13 MED ORDER — POLYETHYLENE GLYCOL 3350 17 GRAM PO PWPK
1 | Freq: Two times a day (BID) | ORAL | 0 refills | Status: DC
Start: 2019-02-13 — End: 2019-02-15

## 2019-02-13 MED ORDER — FOLIC ACID 1 MG PO TAB
1 mg | Freq: Every day | ORAL | 0 refills | Status: DC
Start: 2019-02-13 — End: 2019-02-15
  Administered 2019-02-15: 15:00:00 1 mg via ORAL

## 2019-02-13 MED ORDER — THIAMINE MONONITRATE (VIT B1) 100 MG PO TAB
100 mg | Freq: Every day | ORAL | 0 refills | Status: DC
Start: 2019-02-13 — End: 2019-02-15
  Administered 2019-02-14 – 2019-02-15 (×2): 100 mg via ORAL

## 2019-02-13 MED ORDER — AZITHROMYCIN 250 MG PO TAB
500 mg | ORAL | 0 refills | Status: DC
Start: 2019-02-13 — End: 2019-02-14

## 2019-02-14 ENCOUNTER — Inpatient Hospital Stay: Admit: 2019-02-14 | Discharge: 2019-02-14

## 2019-02-15 ENCOUNTER — Inpatient Hospital Stay: Admit: 2019-02-15 | Discharge: 2019-02-15

## 2019-02-15 ENCOUNTER — Encounter: Admit: 2019-02-15 | Discharge: 2019-02-15

## 2019-02-15 MED ORDER — MAGNESIUM HYDROXIDE 2,400 MG/10 ML PO SUSP
10 mL | Freq: Two times a day (BID) | ORAL | 0 refills | 2.00000 days | Status: AC
Start: 2019-02-15 — End: ?

## 2019-02-15 MED ORDER — OXYCODONE 5 MG PO TAB
5 mg | ORAL_TABLET | ORAL | 0 refills | 6.00000 days | Status: AC | PRN
Start: 2019-02-15 — End: ?
  Filled 2019-02-15: qty 30, 5d supply, fill #1

## 2019-02-15 MED ORDER — POLYETHYLENE GLYCOL 3350 17 GRAM PO PWPK
17 g | Freq: Two times a day (BID) | ORAL | 0 refills | 18.00000 days | Status: AC
Start: 2019-02-15 — End: ?

## 2019-02-15 MED ORDER — FOLIC ACID 1 MG PO TAB
1 mg | ORAL_TABLET | Freq: Every day | ORAL | 0 refills | Status: AC
Start: 2019-02-15 — End: ?

## 2019-02-15 MED ORDER — ACETAMINOPHEN 160 MG/5 ML PO SOLN
650 mg | ORAL | 0 refills | Status: AC | PRN
Start: 2019-02-15 — End: ?

## 2019-02-15 MED ORDER — TRAMADOL 50 MG PO TAB
50-100 mg | ORAL_TABLET | ORAL | 0 refills | Status: DC | PRN
Start: 2019-02-15 — End: 2019-04-11
  Filled 2019-02-15: qty 30, 4d supply, fill #1

## 2019-02-20 ENCOUNTER — Encounter: Admit: 2019-02-20 | Discharge: 2019-02-20

## 2019-02-20 ENCOUNTER — Encounter: Admit: 2019-02-20 | Discharge: 2019-02-20 | Payer: MEDICAID

## 2019-02-20 DIAGNOSIS — Z8719 Personal history of other diseases of the digestive system: Secondary | ICD-10-CM

## 2019-02-20 DIAGNOSIS — J449 Chronic obstructive pulmonary disease, unspecified: Secondary | ICD-10-CM

## 2019-02-20 DIAGNOSIS — C679 Malignant neoplasm of bladder, unspecified: Secondary | ICD-10-CM

## 2019-02-20 DIAGNOSIS — I10 Essential (primary) hypertension: Secondary | ICD-10-CM

## 2019-02-20 DIAGNOSIS — K759 Inflammatory liver disease, unspecified: Secondary | ICD-10-CM

## 2019-02-20 DIAGNOSIS — R079 Chest pain, unspecified: Secondary | ICD-10-CM

## 2019-02-20 DIAGNOSIS — I4891 Unspecified atrial fibrillation: Secondary | ICD-10-CM

## 2019-02-20 NOTE — Procedures
Clean and prepped in standard fashion.  2% lidocaine jelly for local anesthesia.  16 French flexible cystoscope introduced with ease.  Urethra appears normal.  Prostate with bilobar hypertrophy.  Bladder entered distended with sterile water.  General survey reveals no evidence of mucosal abnormalities or recurrence.  Cystoscope removed.  He tolerated well.

## 2019-02-20 NOTE — Progress Notes
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

## 2019-02-23 NOTE — Progress Notes
Date of Service: 02/27/2019       Subjective:             Alexis Weeks is a 68 y.o. male.      History of Present Illness Alexis Weeks presents to thoracic surgery clinic for postoperative care. Alexis Weeks presented to our service with hemoptysis from a bronchial artery isolated to the left upper lobe. Minimally invasive procedures by the IR and interventional pulmonology teams were not successful in localizing and intervening on this arterial bleed. Therefore surgical resection was recommended.  He underwent a Therapeutic bronchoscopy, Regional intercostal and serratus anterior nerve block, Thoracotomy with left upper lobectomy, Wedge resection of left lower lobe on 02/05/19 under the direction of Dr. Particia Nearing. On 02/06/2019 CTS took over as primary team and moved to [facility] intensive care unit where he remained intubated and sedated. Nutrition was consulted and he was started on enteral feedings per NASO-GASTRIC TUBE.  On 02/07/2019 the patient was extubated successfully . 02/08/2019 Weaning Precedex to off. ?CorPak feeds going. ?Continue chest tubes to suction. ?Follow daily chest x-ray. ?On Levaquin for UTI. 02/09/2019  Chest tubes to waterseal  with repeat chest x-ray. ?Patient is on Levaquin for UTI. ?Patient was threatening to leave AGAINST MEDICAL ADVICE at this time. ?Explained given to the patient on the risk, benefits, and alternatives associate with leaving. 02/10/2019 lower extremity edema noted on exam ??Continue Levaquin for UTI. ?Continue regular diet. ?Will get right lower extremity ultrasound to rule out DVT. ?Continue to encourage out of bed ambulation. ?Chest tubes to remain to waterseal. 02/11/2019 White blood cell count slightly elevated at 12.5 from 10.5. ?Continued to monitor. ?Patient with known UTI currently being treated with Levaquin. ?Chest tubes to remain to waterseal. 02/12/2019 Pt moved to telemetry status on  [facility] floor with continued chest tubes in place. 02/13/2019 Posterior chest tube removed with stable follow up chest xray. 02/14/2019 anterior chest tube clamp trial done with stable follow up xray. Anterior chest tube then removed with stable follow up 2 view chest xray. Later in the evening the patient was noted to be smoking in his bathroom. He denied this, however cigarette butt observed in the patient's bathroom and heavy smoke noted in the air. Pt given risks and asked to stop smoking. 02/15/2019 pt again found to be smoking in his room by Dr. Particia Nearing and Dr Effie Berkshire. Pt threatening to leave AMA if not discharged. After conversation with Dr Ignacia Palma the patient was cleared for discharge by Dr. Particia Nearing ?He increased his activity and oral intake to a regular diet. He?had normal bowel and bladder function, and was stable to be discharged home on 02/15/2019 to his brothers care.? Surgical follow up  and chest tube suture removal is pending on discharge.    Patient is doing well and slowly recovering.     His CXR was completed today and was reviewed in detail.    Pathology/ Final Diagnosis:     A. Bone and marrow, 6th rib, resection:   Bone and marrow with trilineage hematopoiesis.     B. Lung, left lower lobe wedge, wedge resection:   Bulla/bleb. ?     C. Lymph node, level 12 lymph node, excision:   Negative for malignancy. ?     D. Lymph node, level 5 lymph node, excision:   Negative for malignancy.?     E. Lung and lymph nodes (5), left upper lobe, lobectomy:   Lung: Alveolar hemorrhage (clinical history of hemoptysis from  left   upper lobe bronchial artery).   Lymph nodes: Negative for malignancy. ?        ROS  Review of Systems   All other systems reviewed and are negative.  ?  Objective:         ? acetaminophen (TYLENOL) 160 mg/5 mL oral solution Take 20.3 mL by mouth every 6 hours as needed.   ? albuterol sulfate (PROAIR HFA) 90 mcg/actuation HFA aerosol inhaler Inhale one puff to two puffs by mouth into the lungs every 6 hours as needed for Wheezing or Shortness of Breath. Shake well before use. ? aspirin 81 mg chewable tablet Chew one tablet by mouth daily. Take with food.   ? azithromycin (ZITHROMAX) 250 mg tablet Take two tablets by mouth three times weekly.   ? cholecalciferol (VITAMIN D3) 2,000 unit tablet Take 2,000 Units by mouth daily.   ? cyclobenzaprine (FLEXERIL) 10 mg tablet Take 10 mg by mouth three times daily as needed for Muscle Cramps.   ? diltiazem CD (CARDIZEM CD) 360 mg capsule Take one capsule by mouth daily.   ? docusate (COLACE) 100 mg capsule Take 100 mg by mouth daily as needed for Constipation.   ? dronabinoL (MARINOL) 5 mg capsule Take 5 mg by mouth twice daily.   ? duloxetine DR (CYMBALTA) 60 mg capsule TAKE ONE CAPSULE BY MOUTH EVERY DAY AS DIRECTED   ? folic acid (FOLVITE) 1 mg tablet Take one tablet by mouth daily.   ? gabapentin (NEURONTIN) 300 mg capsule Take 300 mg by mouth every 8 hours as needed.   ? lipase-protease-amylase (PANCREAZE) 21,000-54,700- 83,900 units capsule Take one capsule by mouth as directed. Take 3 tablets with every meal and 2 tablets with everysnack   ? lisinopriL (ZESTRIL) 10 mg tablet Take 10 mg by mouth daily.   ? metoprolol tartrate (LOPRESSOR) 100 mg tablet Take 100 mg by mouth twice daily.   ? milk of magnesia (CONC) 2,400 mg/10 mL oral suspension Take 10 mL by mouth twice daily.   ? morphine SR (MS CONTIN) 15 mg tablet Take 15 mg by mouth every 12 hours   ? oxybutynin chloride (DITROPAN) 5 mg tablet Take one tablet by mouth three times daily as needed.   ? oxyCODONE (ROXICODONE) 5 mg tablet Take one tablet by mouth every 4 hours as needed   ? polyethylene glycol 3350 (MIRALAX) 17 g packet Take one packet by mouth twice daily.   ? traMADoL (ULTRAM) 50 mg tablet Take one tablet to two tablets by mouth every 6 hours as needed.   ? umeclidinium-vilanteroL (ANORO ELLIPTA) 62.5-25 mcg/actuation inhaler Inhale one puff by mouth into the lungs daily.     Vitals:    02/27/19 0957   BP: 137/82   Pulse: 78   Temp: 36.2 ?C (97.2 ?F)   SpO2: 96% Weight: 57.6 kg (127 lb)   Height: 1.829 m (6')   PainSc: Seven     Body mass index is 17.22 kg/m?Marland Kitchen     Physical Exam  Vitals signs reviewed.   Constitutional:       Appearance: He is well-developed.   HENT:      Head: Normocephalic.      Nose: Nose normal.   Eyes:      Conjunctiva/sclera: Conjunctivae normal.   Neck:      Musculoskeletal: Neck supple.   Cardiovascular:      Rate and Rhythm: Normal rate and regular rhythm.      Heart sounds: Normal heart  sounds.   Pulmonary:      Effort: Pulmonary effort is normal.      Breath sounds: Normal breath sounds.   Musculoskeletal: Normal range of motion.   Skin:     General: Skin is warm and dry.   Neurological:      Mental Status: He is alert and oriented to person, place, and time.   Psychiatric:         Behavior: Behavior normal.         Thought Content: Thought content normal.         Judgment: Judgment normal.       Incision clean, dry, intact, well approximated, no s/s infection, stitches removed with ease.         Assessment and Plan:  1. S/P lobectomy of lung     2. Hemoptysis         Plan:  Stitches removed.  DC from clinic.     Micheal Likens, APRN  Thoracic Surgery and Lung Cancer Screening              ATTESTATION  ?  I personally interviewed and examined the patient.  I have reviewed the history, physical, impression and plan outlined by the Nurse Practitioner. I had the pleasure of seeing Mr. Felgar today in thoracic surgery clinic.  As you are aware he is a 68 year old male who underwent a left thoracotomy with left upper lobectomy and left lower lobe wedge resection on February 05, 2019 for massive hemoptysis.  Patient returns today for his postoperative visit.  Chest x-ray demonstrates no evidence of pneumothorax.  Incisions of healed appropriately.  Pain is well controlled.  As such we will see the patient back on an as-needed basis only.  I appreciate the opportunity to participate in his care.  Please feel free to contact my office any questions or concerns.    Staff name: Oleh Genin. Ignacia Palma, MD

## 2019-02-27 ENCOUNTER — Encounter: Admit: 2019-02-27 | Discharge: 2019-02-27

## 2019-02-27 ENCOUNTER — Ambulatory Visit: Admit: 2019-02-27 | Discharge: 2019-02-27 | Payer: MEDICAID

## 2019-02-27 ENCOUNTER — Ambulatory Visit: Admit: 2019-02-27 | Discharge: 2019-02-27

## 2019-02-27 DIAGNOSIS — K759 Inflammatory liver disease, unspecified: Secondary | ICD-10-CM

## 2019-02-27 DIAGNOSIS — Z8719 Personal history of other diseases of the digestive system: Secondary | ICD-10-CM

## 2019-02-27 DIAGNOSIS — I10 Essential (primary) hypertension: Secondary | ICD-10-CM

## 2019-02-27 DIAGNOSIS — R042 Hemoptysis: Secondary | ICD-10-CM

## 2019-02-27 DIAGNOSIS — I4891 Unspecified atrial fibrillation: Secondary | ICD-10-CM

## 2019-02-27 DIAGNOSIS — R079 Chest pain, unspecified: Secondary | ICD-10-CM

## 2019-02-27 DIAGNOSIS — J449 Chronic obstructive pulmonary disease, unspecified: Secondary | ICD-10-CM

## 2019-02-27 DIAGNOSIS — Z902 Acquired absence of lung [part of]: Secondary | ICD-10-CM

## 2019-02-27 NOTE — Patient Instructions
Please feel free to call us at [phone]

## 2019-03-12 ENCOUNTER — Encounter: Admit: 2019-03-12 | Discharge: 2019-03-12

## 2019-04-06 ENCOUNTER — Encounter: Admit: 2019-04-06 | Discharge: 2019-04-06

## 2019-04-06 ENCOUNTER — Emergency Department: Admit: 2019-04-06 | Discharge: 2019-04-06

## 2019-04-06 ENCOUNTER — Inpatient Hospital Stay: Admit: 2019-04-06 | Payer: MEDICAID

## 2019-04-06 DIAGNOSIS — K759 Inflammatory liver disease, unspecified: Secondary | ICD-10-CM

## 2019-04-06 DIAGNOSIS — J189 Pneumonia, unspecified organism: Secondary | ICD-10-CM

## 2019-04-06 DIAGNOSIS — Z8719 Personal history of other diseases of the digestive system: Secondary | ICD-10-CM

## 2019-04-06 DIAGNOSIS — I4891 Unspecified atrial fibrillation: Secondary | ICD-10-CM

## 2019-04-06 DIAGNOSIS — I10 Essential (primary) hypertension: Secondary | ICD-10-CM

## 2019-04-06 DIAGNOSIS — J449 Chronic obstructive pulmonary disease, unspecified: Secondary | ICD-10-CM

## 2019-04-06 DIAGNOSIS — R079 Chest pain, unspecified: Secondary | ICD-10-CM

## 2019-04-06 MED ORDER — IOHEXOL 350 MG IODINE/ML IV SOLN
70 mL | Freq: Once | INTRAVENOUS | 0 refills | Status: CP
Start: 2019-04-06 — End: ?
  Administered 2019-04-07: 03:00:00 70 mL via INTRAVENOUS

## 2019-04-06 MED ORDER — FENTANYL CITRATE (PF) 50 MCG/ML IJ SOLN
25 ug | Freq: Once | INTRAVENOUS | 0 refills | Status: CP
Start: 2019-04-06 — End: ?
  Administered 2019-04-07: 01:00:00 25 ug via INTRAVENOUS

## 2019-04-06 MED ORDER — NICOTINE 21 MG/24 HR TD PT24
1 | Freq: Every day | TRANSDERMAL | 0 refills | Status: DC
Start: 2019-04-06 — End: 2019-04-07

## 2019-04-06 MED ORDER — CEFEPIME 2G/100ML NS IVPB (MB+)
2 g | Freq: Once | INTRAVENOUS | 0 refills | Status: CP
Start: 2019-04-06 — End: ?
  Administered 2019-04-07 (×2): 2 g via INTRAVENOUS

## 2019-04-06 MED ORDER — METOPROLOL TARTRATE 50 MG PO TAB
100 mg | Freq: Once | ORAL | 0 refills | Status: CP
Start: 2019-04-06 — End: ?
  Administered 2019-04-07: 05:00:00 100 mg via ORAL

## 2019-04-06 MED ORDER — VANCOMYCIN 1,250 MG IVPB
20 mg/kg | Freq: Once | INTRAVENOUS | 0 refills | Status: CP
Start: 2019-04-06 — End: ?
  Administered 2019-04-07 (×2): 1250 mg via INTRAVENOUS

## 2019-04-06 MED ORDER — LACTATED RINGERS IV SOLP
30 mL/kg | INTRAVENOUS | 0 refills | Status: CP
Start: 2019-04-06 — End: ?
  Administered 2019-04-07: 01:00:00 1728 mL via INTRAVENOUS

## 2019-04-06 MED ORDER — KETOROLAC 15 MG/ML IJ SOLN
15 mg | Freq: Once | INTRAVENOUS | 0 refills | Status: CP
Start: 2019-04-06 — End: ?
  Administered 2019-04-07: 05:00:00 15 mg via INTRAVENOUS

## 2019-04-06 MED ORDER — DILTIAZEM HCL 5 MG/ML IV SOLN
10 mg | Freq: Once | INTRAVENOUS | 0 refills | Status: CP
Start: 2019-04-06 — End: ?
  Administered 2019-04-07: 01:00:00 10 mg via INTRAVENOUS

## 2019-04-06 MED ORDER — FENTANYL CITRATE (PF) 50 MCG/ML IJ SOLN
50 ug | Freq: Once | INTRAVENOUS | 0 refills | Status: CP
Start: 2019-04-06 — End: ?
  Administered 2019-04-07: 02:00:00 50 ug via INTRAVENOUS

## 2019-04-06 MED ORDER — SODIUM CHLORIDE 0.9 % IJ SOLN
50 mL | Freq: Once | INTRAVENOUS | 0 refills | Status: CP
Start: 2019-04-06 — End: ?
  Administered 2019-04-07: 03:00:00 50 mL via INTRAVENOUS

## 2019-04-07 ENCOUNTER — Inpatient Hospital Stay: Admit: 2019-04-07 | Discharge: 2019-04-07

## 2019-04-07 ENCOUNTER — Encounter: Admit: 2019-04-07 | Discharge: 2019-04-07

## 2019-04-07 LAB — COMPREHENSIVE METABOLIC PANEL
Lab: 1.2 mg/dL (ref 0.3–1.2)
Lab: 111 mg/dL — ABNORMAL HIGH (ref 70–100)
Lab: 14 U/L (ref 7–56)
Lab: 141 MMOL/L — ABNORMAL LOW (ref 137–147)
Lab: 29 MMOL/L (ref 21–30)
Lab: 3.5 g/dL — ABNORMAL HIGH (ref 3.5–5.0)
Lab: 33 U/L (ref 7–40)
Lab: 4.1 MMOL/L — ABNORMAL LOW (ref 3.5–5.1)
Lab: 6.4 g/dL — ABNORMAL LOW (ref 6.0–8.0)
Lab: 8 K/UL — ABNORMAL HIGH (ref 3–12)
Lab: 91 U/L — ABNORMAL LOW (ref 25–110)
Lab: >60 mL/min — ABNORMAL HIGH (ref >60)
Lab: >60 mL/min — ABNORMAL LOW (ref >60)

## 2019-04-07 LAB — URINALYSIS DIPSTICK REFLEX TO CULTURE
Lab: NEGATIVE
Lab: NEGATIVE
Lab: NEGATIVE
Lab: NEGATIVE
Lab: NEGATIVE
Lab: NEGATIVE

## 2019-04-07 LAB — URINALYSIS MICROSCOPIC REFLEX TO CULTURE

## 2019-04-07 LAB — CBC AND DIFF
Lab: 0 10*3/uL (ref 0–0.20)
Lab: 0 10*3/uL (ref 0–0.45)
Lab: 15 10*3/uL — ABNORMAL HIGH (ref 4.5–11.0)
Lab: 29 — ABNORMAL HIGH (ref <20.7)

## 2019-04-07 LAB — D-DIMER: Lab: 627 ng{FEU}/mL — ABNORMAL HIGH (ref <500)

## 2019-04-07 LAB — PROTIME INR (PT): Lab: 1.6 — ABNORMAL HIGH (ref 0.8–1.2)

## 2019-04-07 LAB — MAGNESIUM: Lab: 1.7 mg/dL — ABNORMAL LOW (ref 1.6–2.6)

## 2019-04-07 LAB — BNP POC ER: Lab: 990 pg/mL — ABNORMAL HIGH (ref 0–100)

## 2019-04-07 LAB — TSH WITH FREE T4 REFLEX: Lab: 1.3 uU/mL — ABNORMAL HIGH (ref 0.35–5.00)

## 2019-04-07 LAB — POC TROPONIN: Lab: 0 ng/mL (ref 0.00–0.05)

## 2019-04-07 LAB — COVID-19 (SARS-COV-2) PCR

## 2019-04-07 LAB — POC LACTATE: Lab: 1.7 MMOL/L (ref 0.5–2.0)

## 2019-04-07 LAB — LIPASE: Lab: <3 U/L — ABNORMAL LOW (ref 11–82)

## 2019-04-07 MED ORDER — NICOTINE 21 MG/24 HR TD PT24
1 | Freq: Every day | TRANSDERMAL | 0 refills | Status: DC
Start: 2019-04-07 — End: 2019-04-07

## 2019-04-07 MED ORDER — POLYETHYLENE GLYCOL 3350 17 GRAM PO PWPK
17 g | Freq: Two times a day (BID) | ORAL | 0 refills | Status: DC
Start: 2019-04-07 — End: 2019-04-12
  Administered 2019-04-08 – 2019-04-09 (×2): 17 g via ORAL

## 2019-04-07 MED ORDER — OXYCODONE 5 MG PO TAB
5 mg | ORAL | 0 refills | Status: DC | PRN
Start: 2019-04-07 — End: 2019-04-07
  Administered 2019-04-07: 09:00:00 5 mg via ORAL

## 2019-04-07 MED ORDER — TRAMADOL 50 MG PO TAB
50-100 mg | ORAL | 0 refills | Status: DC | PRN
Start: 2019-04-07 — End: 2019-04-07

## 2019-04-07 MED ORDER — MAGNESIUM HYDROXIDE 2,400 MG/10 ML PO SUSP
10 mL | Freq: Two times a day (BID) | ORAL | 0 refills | Status: DC
Start: 2019-04-07 — End: 2019-04-12
  Administered 2019-04-08 – 2019-04-09 (×2): 10 mL via ORAL

## 2019-04-07 MED ORDER — FOLIC ACID 1 MG PO TAB
1 mg | Freq: Every day | ORAL | 0 refills | Status: DC
Start: 2019-04-07 — End: 2019-04-12
  Administered 2019-04-07 – 2019-04-11 (×4): 1 mg via ORAL

## 2019-04-07 MED ORDER — CEFTRIAXONE INJ 1GM IVP
1 g | INTRAVENOUS | 0 refills | Status: CP
Start: 2019-04-07 — End: ?
  Administered 2019-04-07 – 2019-04-11 (×5): 1 g via INTRAVENOUS

## 2019-04-07 MED ORDER — MULTIVIT-IRON-FA-CALCIUM-MINS 9 MG IRON-400 MCG PO TAB
1 | Freq: Every day | ORAL | 0 refills | Status: DC
Start: 2019-04-07 — End: 2019-04-12
  Administered 2019-04-07 – 2019-04-11 (×5): 1 via ORAL

## 2019-04-07 MED ORDER — ENOXAPARIN 60 MG/0.6 ML SC SYRG
60 mg | Freq: Two times a day (BID) | SUBCUTANEOUS | 0 refills | Status: DC
Start: 2019-04-07 — End: 2019-04-10
  Administered 2019-04-08 – 2019-04-10 (×4): 60 mg via SUBCUTANEOUS

## 2019-04-07 MED ORDER — ASCORBIC ACID (VITAMIN C) 500 MG PO TAB
500 mg | Freq: Every day | ORAL | 0 refills | Status: DC
Start: 2019-04-07 — End: 2019-04-12
  Administered 2019-04-07 – 2019-04-11 (×5): 500 mg via ORAL

## 2019-04-07 MED ORDER — LISINOPRIL 10 MG PO TAB
10 mg | Freq: Every day | ORAL | 0 refills | Status: DC
Start: 2019-04-07 — End: 2019-04-08
  Administered 2019-04-07: 15:00:00 10 mg via ORAL

## 2019-04-07 MED ORDER — ENOXAPARIN 60 MG/0.6 ML SC SYRG
60 mg | Freq: Once | SUBCUTANEOUS | 0 refills | Status: CP
Start: 2019-04-07 — End: ?
  Administered 2019-04-07: 16:00:00 60 mg via SUBCUTANEOUS

## 2019-04-07 MED ORDER — DILTIAZEM HCL 180 MG PO CP24
360 mg | Freq: Every day | ORAL | 0 refills | Status: DC
Start: 2019-04-07 — End: 2019-04-10
  Administered 2019-04-07 – 2019-04-09 (×3): 360 mg via ORAL

## 2019-04-07 MED ORDER — ASPIRIN 81 MG PO CHEW
81 mg | Freq: Every day | ORAL | 0 refills | Status: DC
Start: 2019-04-07 — End: 2019-04-12
  Administered 2019-04-07 – 2019-04-11 (×5): 81 mg via ORAL

## 2019-04-07 MED ORDER — ENOXAPARIN 40 MG/0.4 ML SC SYRG
40 mg | Freq: Every day | SUBCUTANEOUS | 0 refills | Status: DC
Start: 2019-04-07 — End: 2019-04-07

## 2019-04-07 MED ORDER — ZINC SULFATE 220 (50) MG PO CAP
220 mg | Freq: Every day | ORAL | 0 refills | Status: DC
Start: 2019-04-07 — End: 2019-04-12
  Administered 2019-04-07 – 2019-04-11 (×5): 220 mg via ORAL

## 2019-04-07 MED ORDER — AZITHROMYCIN 250 MG PO TAB
500 mg | Freq: Every day | ORAL | 0 refills | Status: DC
Start: 2019-04-07 — End: 2019-04-08
  Administered 2019-04-07: 19:00:00 500 mg via ORAL

## 2019-04-07 MED ORDER — VANCOMYCIN  750 MG IVPB
15 mg/kg | INTRAVENOUS | 0 refills | Status: DC
Start: 2019-04-07 — End: 2019-04-07

## 2019-04-07 MED ORDER — ASPIRIN 325 MG PO TAB
325 mg | Freq: Once | ORAL | 0 refills | Status: CP
Start: 2019-04-07 — End: ?
  Administered 2019-04-08: 06:00:00 325 mg via ORAL

## 2019-04-07 MED ORDER — OXYBUTYNIN CHLORIDE 5 MG PO TAB
5 mg | Freq: Three times a day (TID) | ORAL | 0 refills | Status: DC | PRN
Start: 2019-04-07 — End: 2019-04-12

## 2019-04-07 MED ORDER — DULOXETINE 60 MG PO CPDR
60 mg | Freq: Every day | ORAL | 0 refills | Status: DC
Start: 2019-04-07 — End: 2019-04-12
  Administered 2019-04-07 – 2019-04-11 (×5): 60 mg via ORAL

## 2019-04-07 MED ORDER — TRIMETHOBENZAMIDE 100 MG/ML IM SOLN
200 mg | INTRAMUSCULAR | 0 refills | Status: DC | PRN
Start: 2019-04-07 — End: 2019-04-12
  Administered 2019-04-08: 04:00:00 200 mg via INTRAMUSCULAR

## 2019-04-07 MED ORDER — DICLOFENAC SODIUM 1 % TP GEL
2 g | Freq: Four times a day (QID) | TOPICAL | 0 refills | Status: DC
Start: 2019-04-07 — End: 2019-04-12
  Administered 2019-04-07: 19:00:00 2 g via TOPICAL

## 2019-04-07 MED ORDER — CEFEPIME 2G/100ML NS IVPB (MB+)
2 g | INTRAVENOUS | 0 refills | Status: DC
Start: 2019-04-07 — End: 2019-04-07
  Administered 2019-04-07 (×2): 2 g via INTRAVENOUS

## 2019-04-07 MED ORDER — METOPROLOL TARTRATE 100 MG PO TAB
100 mg | Freq: Two times a day (BID) | ORAL | 0 refills | Status: DC
Start: 2019-04-07 — End: 2019-04-12
  Administered 2019-04-07 – 2019-04-11 (×6): 100 mg via ORAL

## 2019-04-07 MED ORDER — OXYCODONE 10 MG PO TAB
20 mg | ORAL | 0 refills | Status: DC | PRN
Start: 2019-04-07 — End: 2019-04-12
  Administered 2019-04-07 – 2019-04-11 (×8): 20 mg via ORAL

## 2019-04-07 MED ORDER — VANCOMYCIN PHARMACY TO MANAGE
1 | 0 refills | Status: DC
Start: 2019-04-07 — End: 2019-04-07

## 2019-04-07 MED ORDER — UMECLIDINIUM-VILANTEROL 62.5-25 MCG/ACTUATION IN DSDV
1 | Freq: Every day | RESPIRATORY_TRACT | 0 refills | Status: DC
Start: 2019-04-07 — End: 2019-04-12
  Administered 2019-04-07: 10:00:00 1 via RESPIRATORY_TRACT

## 2019-04-07 MED ORDER — ALBUTEROL SULFATE 90 MCG/ACTUATION IN HFAA
1-2 | RESPIRATORY_TRACT | 0 refills | Status: DC | PRN
Start: 2019-04-07 — End: 2019-04-12
  Administered 2019-04-09: 03:00:00 2 via RESPIRATORY_TRACT

## 2019-04-07 MED ORDER — LIPASE-PROTEASE-AMYLASE (PAN MT20) 21,000-54,700- 83,900 UNIT PO CPDR
1 | ORAL | 0 refills | Status: DC
Start: 2019-04-07 — End: 2019-04-12

## 2019-04-07 MED ORDER — AZITHROMYCIN 250 MG PO TAB
500 mg | ORAL | 0 refills | Status: DC
Start: 2019-04-07 — End: 2019-04-07

## 2019-04-07 MED ORDER — NICOTINE 21 MG/24 HR TD PT24
1 | Freq: Every day | TRANSDERMAL | 0 refills | Status: DC
Start: 2019-04-07 — End: 2019-04-12
  Administered 2019-04-07 – 2019-04-11 (×5): 1 via TRANSDERMAL

## 2019-04-07 MED ORDER — OXYCODONE 15 MG PO TAB
30 mg | ORAL | 0 refills | Status: DC | PRN
Start: 2019-04-07 — End: 2019-04-07

## 2019-04-07 NOTE — ED Notes
Report received from [place], [place]. Pt AxO 4; has moments of confusion, breathing even, labored on 4L. Skin race appropriate, warm, and dry. Pt on cardiac monitor,A-fib, HTN,. Bed in lowest position, locked, call light within reach. Pt given warm blankets, continues to receive sepsis bolus of LR, Vanc to be started, pt resituated, bed and cords resituated. Preparing to take pt to CT.
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
## 2019-04-07 NOTE — ED Notes
Pt agitated continues to ask for pain medications; pt informed pain medications were just administered. Pt continues to ask for warm blankets after given several. Pt appears disoriented to actions occurring. Thoughts and verbal communication shows disorientation.

## 2019-04-08 MED ORDER — AZITHROMYCIN 250 MG PO TAB
250 mg | Freq: Every day | ORAL | 0 refills | Status: CP
Start: 2019-04-08 — End: ?
  Administered 2019-04-08 – 2019-04-11 (×4): 250 mg via ORAL

## 2019-04-08 MED ORDER — NICOTINE 21 MG/24 HR TD PT24
1 | Freq: Once | TRANSDERMAL | 0 refills | Status: AC
Start: 2019-04-08 — End: ?

## 2019-04-08 MED ORDER — NICOTINE (POLACRILEX) 2 MG BU LOZG
2 mg | ORAL | 0 refills | Status: DC | PRN
Start: 2019-04-08 — End: 2019-04-12

## 2019-04-08 MED ORDER — LACTATED RINGERS IV SOLP
500 mL | Freq: Once | INTRAVENOUS | 0 refills | Status: CP
Start: 2019-04-08 — End: ?
  Administered 2019-04-08: 17:00:00 500 mL via INTRAVENOUS

## 2019-04-09 MED ORDER — TRAZODONE 50 MG PO TAB
25 mg | Freq: Every evening | ORAL | 0 refills | Status: DC | PRN
Start: 2019-04-09 — End: 2019-04-12
  Administered 2019-04-10 – 2019-04-11 (×2): 25 mg via ORAL

## 2019-04-09 MED ORDER — SODIUM CHLORIDE 0.9 % IV SOLP
1000 mL | Freq: Once | INTRAVENOUS | 0 refills | Status: CP
Start: 2019-04-09 — End: ?
  Administered 2019-04-09: 15:00:00 1000 mL via INTRAVENOUS

## 2019-04-09 MED ORDER — DIPHENHYDRAMINE HCL 50 MG/ML IJ SOLN
25 mg | Freq: Once | INTRAVENOUS | 0 refills | Status: CP
Start: 2019-04-09 — End: ?
  Administered 2019-04-09: 08:00:00 25 mg via INTRAVENOUS

## 2019-04-10 MED ORDER — RIVAROXABAN 15 MG PO TAB
15 mg | Freq: Two times a day (BID) | ORAL | 0 refills | Status: DC
Start: 2019-04-10 — End: 2019-04-12
  Administered 2019-04-11: 15:00:00 15 mg via ORAL

## 2019-04-10 MED ORDER — RIVAROXABAN 15 MG PO TAB
15 mg | Freq: Once | ORAL | 0 refills | Status: CP
Start: 2019-04-10 — End: ?
  Administered 2019-04-11: 02:00:00 15 mg via ORAL

## 2019-04-10 MED ORDER — IMS MIXTURE TEMPLATE
300 mg | Freq: Every day | ORAL | 0 refills | Status: DC
Start: 2019-04-10 — End: 2019-04-10
  Administered 2019-04-10 (×2): 300 mg via ORAL

## 2019-04-11 ENCOUNTER — Encounter: Admit: 2019-04-11 | Discharge: 2019-04-11

## 2019-04-11 MED ORDER — NICOTINE (POLACRILEX) 4 MG BU GUM
4 mg | BUCCAL | 0 refills | Status: DC | PRN
Start: 2019-04-11 — End: 2019-04-12
  Administered 2019-04-11: 17:00:00 4 mg via BUCCAL

## 2019-04-11 MED ORDER — NICOTINE 21 MG/24 HR TD PT24
1 | MEDICATED_PATCH | Freq: Every day | TRANSDERMAL | 0 refills | Status: AC
Start: 2019-04-11 — End: ?
  Filled 2019-04-11: qty 28, 28d supply, fill #1

## 2019-04-11 MED ORDER — RIVAROXABAN 20 MG PO TAB
20 mg | ORAL_TABLET | Freq: Every day | ORAL | 3 refills | 30.00000 days | Status: AC
Start: 2019-04-11 — End: ?

## 2019-04-11 MED ORDER — RIVAROXABAN 15 MG PO TAB
15 mg | ORAL_TABLET | Freq: Two times a day (BID) | ORAL | 0 refills | 30.00000 days | Status: AC
Start: 2019-04-11 — End: ?
  Filled 2019-04-11: qty 40, 20d supply, fill #1
  Filled 2019-04-11: qty 30, 30d supply, fill #1

## 2019-04-11 MED ORDER — NICOTINE (POLACRILEX) 4 MG BU GUM
4 mg | BUCCAL | 1 refills | Status: AC | PRN
Start: 2019-04-11 — End: ?
  Filled 2019-04-11: qty 220, 10d supply, fill #1

## 2019-04-11 MED ORDER — CEFPODOXIME 200 MG PO TAB
200 mg | ORAL_TABLET | Freq: Two times a day (BID) | ORAL | 0 refills | 7.00000 days | Status: AC
Start: 2019-04-11 — End: ?
  Filled 2019-04-11: qty 4, 2d supply, fill #1

## 2019-04-11 MED ORDER — CEFPODOXIME 200 MG PO TAB
200 mg | Freq: Two times a day (BID) | ORAL | 0 refills | Status: DC
Start: 2019-04-11 — End: 2019-04-12

## 2019-05-09 ENCOUNTER — Encounter: Admit: 2019-05-09 | Discharge: 2019-05-09

## 2019-05-14 DEATH — deceased

## 2019-05-21 ENCOUNTER — Encounter: Admit: 2019-05-21 | Discharge: 2019-05-21

## 2019-05-25 ENCOUNTER — Encounter: Admit: 2019-05-25 | Discharge: 2019-05-25

## 2019-08-20 ENCOUNTER — Encounter: Admit: 2019-08-20 | Discharge: 2019-08-20
